# Patient Record
Sex: Female | Born: 1995 | Race: White | Hispanic: No | Marital: Married | Smoking: Never smoker
Health system: Southern US, Community
[De-identification: ages and names within clinical notes are randomized; demographics above are authoritative.]

## PROBLEM LIST (undated history)

## (undated) DIAGNOSIS — G90A Postural orthostatic tachycardia syndrome (POTS): Secondary | ICD-10-CM

## (undated) DIAGNOSIS — J45909 Unspecified asthma, uncomplicated: Secondary | ICD-10-CM

## (undated) DIAGNOSIS — N809 Endometriosis, unspecified: Secondary | ICD-10-CM

## (undated) HISTORY — PX: NO PAST SURGERIES: SHX2092

---
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
## 2016-06-07 ENCOUNTER — Encounter: Payer: Self-pay | Admitting: *Deleted

## 2016-06-07 ENCOUNTER — Emergency Department
Admission: EM | Admit: 2016-06-07 | Discharge: 2016-06-07 | Disposition: A | Discharge status: Home / Self Care | Payer: Self-pay | Attending: Emergency Medicine | Admitting: Emergency Medicine

## 2016-06-07 DIAGNOSIS — N946 Dysmenorrhea, unspecified: Secondary | ICD-10-CM | POA: Insufficient documentation

## 2016-06-07 MED ORDER — ACETAMINOPHEN-CODEINE #3 300-30 MG PO TABS: 1.0000 | ORAL_TABLET | Freq: Three times a day (TID) | ORAL | 0 refills | Status: DC | PRN

## 2016-06-07 MED ORDER — ONDANSETRON 4 MG PO TBDP: 4.0000 mg | ORAL_TABLET | Freq: Four times a day (QID) | ORAL | 0 refills | Status: DC | PRN

## 2016-06-07 NOTE — ED Provider Notes (Signed)
[facility] Emergency Department Provider Note ____________________________________________  Time seen: 1416  I have reviewed the triage vital signs and the nursing notes.  HISTORY  Chief Complaint  Abdominal Cramping  HPI Karen Mack is a 21 y.o. female presents to the ED for evaluation of severe menstrual cramps. The patient has a history of irregular menses with heavy bleeding and pain. She reports onset of menses today, with increased pelvic pain, cramping, nausea, and vomiting. She took a single Midol pill at onset, about 10 am. She now reports decreased pain from onset; now 3/10 down from 8/10. She denies any history of endometriosis, ovarian cysts, or fibroids. She reports she has never had a pelvic exam and has no current primary provider. She denies fevers, chills, or diarrhea. She came in initially because the cramps seemed to cause generalized pain throughout her body.   History reviewed. No pertinent past medical history.  There are no active problems to display for this patient.  History reviewed. No pertinent surgical history.  Prior to Admission medications   Medication Sig Start Date End Date Taking? Authorizing Provider  acetaminophen-codeine (TYLENOL #3) 300-30 MG tablet Take 1 tablet by mouth every 8 (eight) hours as needed for moderate pain. 06/07/16   Tamara Monteith V Bacon Kyaire Gruenewald, PA-C  ondansetron (ZOFRAN ODT) 4 MG disintegrating tablet Take 1 tablet (4 mg total) by mouth every 6 (six) hours as needed for nausea or vomiting. 06/07/16   Charlesetta IvoryJenise V Bacon Elenor Wildes, PA-C    Allergies Patient has no known allergies.  Family History  Problem Relation Age of Onset  . Cancer Paternal Grandmother   . Diabetes Paternal Grandfather   . Cancer Maternal Grandmother     Social History Social History  Substance Use Topics  . Smoking status: Never Smoker  . Smokeless tobacco: Never Used  . Alcohol use No    Review of Systems  Constitutional: Negative for  fever. Cardiovascular: Negative for chest pain. Respiratory: Negative for shortness of breath. Gastrointestinal: Negative for abdominal pain, vomiting and diarrhea. Genitourinary: Negative for dysuria. Menstrual cramps as above.  Musculoskeletal: Negative for back pain. Skin: Negative for rash. Neurological: Negative for headaches, focal weakness or numbness. ____________________________________________  PHYSICAL EXAM:  VITAL SIGNS: ED Triage Vitals  Enc Vitals Group     BP 06/07/16 1313 124/76     Pulse Rate 06/07/16 1313 (!) 120     Resp 06/07/16 1313 18     Temp 06/07/16 1313 99.5 F (37.5 C)     Temp Source 06/07/16 1313 Oral     SpO2 06/07/16 1313 99 %     Weight 06/07/16 1314 102 lb (46.3 kg)     Height 06/07/16 1314 5\' 1"  (1.549 m)     Head Circumference --      Peak Flow --      Pain Score 06/07/16 1313 8     Pain Loc --      Pain Edu? --      Excl. in GC? --     Constitutional: Alert and oriented. Well appearing and in no distress. Head: Normocephalic and atraumatic. Eyes: Conjunctivae are normal. PERRL. Normal extraocular movements Ears: Canals clear. TMs intact bilaterally. Nose: No congestion/rhinorrhea/epistaxis. Mouth/Throat: Mucous membranes are moist. Cardiovascular: Normal rate, regular rhythm. Normal distal pulses. Respiratory: Normal respiratory effort. No wheezes/rales/rhonchi. Gastrointestinal: Soft and nontender. No distention, rebound, guarding, or organomegaly. No CVA tenderness noted.  Musculoskeletal: Nontender with normal range of motion in all extremities.  Neurologic:  Normal gait without  ataxia. Normal speech and language. No gross focal neurologic deficits are appreciated. Skin:  Skin is warm, dry and intact. No rash noted. Psychiatric: Mood and affect are normal. Patient exhibits appropriate insight and judgment. ____________________________________________  INITIAL IMPRESSION / ASSESSMENT AND PLAN / ED COURSE  Patient with resolving  menstrual pain. She is discharged with on management of symptoms using OTC Midol, ibuprofen, and acetaminophen; at the onset of symptoms. She is also provided with prescriptions for Tylenol #3 and Zofran for pain and nausea, respectively. She is encouraged to follow-up with [facility] for routine medical care and evaluation. She is given a work note for today as requested.  ____________________________________________  FINAL CLINICAL IMPRESSION(S) / ED DIAGNOSES  Final diagnoses:  Dysmenorrhea      Lissa Hoard, PA-C 06/07/16 1706    Sharman Cheek, MD 06/08/16 1544

## 2016-06-07 NOTE — Discharge Instructions (Signed)
Your symptoms are consistent with severe menstrual cramps. Take the prescription nausea medicine and pain medicine along with your usual OTC Midol and Ibuprofen at the onset of symptoms. Hydrate and use moist heating pads to reduce pain. Follow-up with your OBG provider for continued symptoms.

## 2016-06-07 NOTE — ED Triage Notes (Signed)
States she started her period today and is having bad abd cramps that go all over her body, pt crying, states she took midol. States hx of heavy periods but states she has never seen on obgyn, crying in triage

## 2016-06-07 NOTE — ED Notes (Signed)
Mom states pt normally has heavy periods, began period today. Pt is crying. Mom states "pain through out body." when asked pt she states period pain that "shoots down my legs and my arms." Pt alert and oriented, able to move freely. Pt states she took midol this AM. States 1 pad per hour, normal period blood color. States pain with periods have never been this bad. Denies taking birth control.

## 2017-05-26 ENCOUNTER — Ambulatory Visit
Admission: EM | Admit: 2017-05-26 | Discharge: 2017-05-26 | Disposition: A | Discharge status: Home / Self Care | Payer: Self-pay | Attending: Family Medicine | Admitting: Family Medicine

## 2017-05-26 DIAGNOSIS — J029 Acute pharyngitis, unspecified: Secondary | ICD-10-CM

## 2017-05-26 LAB — RAPID STREP SCREEN (MED CTR MEBANE ONLY): Streptococcus, Group A Screen (Direct): NEGATIVE

## 2017-05-26 MED ORDER — PREDNISONE 20 MG PO TABS: ORAL_TABLET | ORAL | 0 refills | Status: DC

## 2017-05-26 NOTE — ED Triage Notes (Signed)
Sore throat symptoms started around 4 days ago.  Has redness and soreness.  Sore when yawning, swallowing, or turning head mostly to the right.   Fever yesterday though.   Denies sick contacts.

## 2017-05-26 NOTE — ED Provider Notes (Signed)
[facility]    CSN: [id number] Arrival date & time: 05/26/17  1421     History   Chief Complaint Chief Complaint  Patient presents with  . Sore Throat    HPI Karen Mack is a 22 y.o. female.   HPI  22 year old female presents with sore throat symptoms that started about 4 days ago.  Planes of redness and soreness particularly when yawning swallowing or turning her head.  She felt feverish yesterday but did not take her temperature.  She is afebrile today.  She does not complain of any coughing.  Works as a Presenter, broadcasting        History reviewed. No pertinent past medical history.  There are no active problems to display for this patient.   History reviewed. No pertinent surgical history.  OB History    No data available       Home Medications    Prior to Admission medications   Medication Sig Start Date End Date Taking? Authorizing Provider  acetaminophen-codeine (TYLENOL #3) 300-30 MG tablet Take 1 tablet by mouth every 8 (eight) hours as needed for moderate pain. 06/07/16   Menshew, Charlesetta Ivory, PA-C  ondansetron (ZOFRAN ODT) 4 MG disintegrating tablet Take 1 tablet (4 mg total) by mouth every 6 (six) hours as needed for nausea or vomiting. 06/07/16   Menshew, Charlesetta Ivory, PA-C  predniSONE (DELTASONE) 20 MG tablet Take 1 tablet (20 mg) daily by mouth. 05/26/17   Lutricia Feil, PA-C    Family History Family History  Problem Relation Age of Onset  . Cancer Paternal Grandmother   . Diabetes Paternal Grandfather   . Cancer Maternal Grandmother     Social History Social History   Tobacco Use  . Smoking status: Never Smoker  . Smokeless tobacco: Never Used  Substance Use Topics  . Alcohol use: No  . Drug use: No     Allergies   Patient has no known allergies.   Review of Systems Review of Systems  Constitutional: Positive for activity change, appetite change, fatigue and fever. Negative for chills.  HENT: Positive for  sore throat.   Respiratory: Negative for cough.   All other systems reviewed and are negative.    Physical Exam Triage Vital Signs ED Triage Vitals  Enc Vitals Group     BP 05/26/17 1454 98/63     Pulse Rate 05/26/17 1454 79     Resp 05/26/17 1454 16     Temp 05/26/17 1454 98.4 F (36.9 C)     Temp Source 05/26/17 1454 Oral     SpO2 05/26/17 1454 100 %     Weight --      Height --      Head Circumference --      Peak Flow --      Pain Score 05/26/17 1456 4     Pain Loc --      Pain Edu? --      Excl. in GC? --    No data found.  Updated Vital Signs BP 98/63 (BP Location: Left Arm)   Pulse 79   Temp 98.4 F (36.9 C) (Oral)   Resp 16   SpO2 100%   Visual Acuity Right Eye Distance:   Left Eye Distance:   Bilateral Distance:    Right Eye Near:   Left Eye Near:    Bilateral Near:     Physical Exam  Constitutional: She is oriented to person, place, and time.  She appears well-developed and well-nourished.  Non-toxic appearance. She does not appear ill. No distress.  HENT:  Head: Normocephalic.  Right Ear: Tympanic membrane normal.  Left Ear: Tympanic membrane normal.  Mouth/Throat: Uvula is midline. Mucous membranes are dry. No uvula swelling. Posterior oropharyngeal erythema present. No oropharyngeal exudate, posterior oropharyngeal edema or tonsillar abscesses. Tonsils are 0 on the right. Tonsils are 0 on the left. No tonsillar exudate.  Eyes: Pupils are equal, round, and reactive to light.  Neck: Normal range of motion. Neck supple.  Pulmonary/Chest: Effort normal and breath sounds normal.  Abdominal: Soft. Bowel sounds are normal.  Neurological: She is alert and oriented to person, place, and time.  Skin: Skin is warm and dry.  Psychiatric: She has a normal mood and affect. Her behavior is normal.  Nursing note and vitals reviewed.    UC Treatments / Results  Labs (all labs ordered are listed, but only abnormal results are displayed) Labs Reviewed    RAPID STREP SCREEN (NOT AT [facility])  CULTURE, GROUP A STREP [facility])    EKG  EKG Interpretation None       Radiology No results found.  Procedures Procedures (including critical care time)  Medications Ordered in UC Medications - No data to display   Initial Impression / Assessment and Plan / UC Course  I have reviewed the triage vital signs and the nursing notes.  Pertinent labs & imaging results that were available during my care of the patient were reviewed by me and considered in my medical decision making (see chart for details).     Plan: 1. Test/x-ray results and diagnosis reviewed with patient 2. rx as per orders; risks, benefits, potential side effects reviewed with patient 3. Recommend supportive treatment with use of salt water gargles or lozenges.  I advised her this is likely a viral illness does not require antibiotics.  Requested something for pain and I will provide her with prednisone because she is having difficulty swallowing and eating.  Worsening of her symptoms she should follow-up with primary care physician or go to the emergency room. 4. F/u prn if symptoms worsen or don't improve   Final Clinical Impressions(s) / UC Diagnoses   Final diagnoses:  Sore throat    ED Discharge Orders        Ordered    predniSONE (DELTASONE) 20 MG tablet     05/26/17 1558       Controlled Substance Prescriptions [place] Controlled Substance Registry consulted? Not Applicable   Lutricia Feil, PA-C 05/26/17 1607

## 2017-05-26 NOTE — ED Notes (Signed)
Pt in treatment room.  In NAD.  Needs address.  Pt/Fam updated on POC.   

## 2017-05-28 ENCOUNTER — Telehealth: Payer: Self-pay | Admitting: Family Medicine

## 2017-05-28 LAB — CULTURE, GROUP A STREP (THRC)

## 2017-05-28 MED ORDER — AMOXICILLIN 500 MG PO TABS: 500.0000 mg | ORAL_TABLET | Freq: Two times a day (BID) | ORAL | 0 refills | Status: DC

## 2017-05-28 NOTE — Telephone Encounter (Signed)
+   Strep. Sending in Amox. 

## 2017-09-14 ENCOUNTER — Other Ambulatory Visit: Payer: Self-pay

## 2017-09-14 ENCOUNTER — Ambulatory Visit (INDEPENDENT_AMBULATORY_CARE_PROVIDER_SITE_OTHER): Payer: Self-pay

## 2017-09-14 ENCOUNTER — Ambulatory Visit
Admission: EM | Admit: 2017-09-14 | Discharge: 2017-09-14 | Disposition: A | Discharge status: Home / Self Care | Payer: Self-pay | Attending: Family Medicine | Admitting: Family Medicine

## 2017-09-14 ENCOUNTER — Encounter: Payer: Self-pay | Admitting: Emergency Medicine

## 2017-09-14 DIAGNOSIS — R0602 Shortness of breath: Secondary | ICD-10-CM

## 2017-09-14 LAB — CBC WITH DIFFERENTIAL/PLATELET
BASOS ABS: 0 10*3/uL (ref 0–0.1)
BASOS PCT: 1 %
Eosinophils Absolute: 0.2 10*3/uL (ref 0–0.7)
Eosinophils Relative: 3 %
HEMATOCRIT: 36.9 % (ref 35.0–47.0)
HEMOGLOBIN: 12.2 g/dL (ref 12.0–16.0)
Lymphocytes Relative: 33 %
Lymphs Abs: 1.7 10*3/uL (ref 1.0–3.6)
MCH: 26.7 pg (ref 26.0–34.0)
MCHC: 33 g/dL (ref 32.0–36.0)
MCV: 81 fL (ref 80.0–100.0)
MONOS PCT: 9 %
Monocytes Absolute: 0.4 10*3/uL (ref 0.2–0.9)
NEUTROS ABS: 2.8 10*3/uL (ref 1.4–6.5)
Neutrophils Relative %: 54 %
Platelets: 209 10*3/uL (ref 150–440)
RBC: 4.56 MIL/uL (ref 3.80–5.20)
RDW: 14.8 % — ABNORMAL HIGH (ref 11.5–14.5)
WBC: 5.2 10*3/uL (ref 3.6–11.0)

## 2017-09-14 LAB — FIBRIN DERIVATIVES D-DIMER (ARMC ONLY): FIBRIN DERIVATIVES D-DIMER (ARMC): 162.13 ng{FEU}/mL (ref 0.00–499.00)

## 2017-09-14 MED ORDER — ALBUTEROL SULFATE HFA 108 (90 BASE) MCG/ACT IN AERS: 1.0000 | INHALATION_SPRAY | Freq: Four times a day (QID) | RESPIRATORY_TRACT | 0 refills | Status: AC | PRN

## 2017-09-14 NOTE — Discharge Instructions (Addendum)
Albuterol as needed.  If persists, see pulmonology.  Take care  Dr. Adriana Simasook

## 2017-09-14 NOTE — ED Provider Notes (Signed)
[facility]    CSN: [id number] Arrival date & time: 09/14/17  4098  History   Chief Complaint Chief Complaint  Patient presents with  . Shortness of Breath   HPI  22 year old female presents with shortness of breath.  Patient reports a 2 to 33-month history of ongoing shortness of breath.  She states that it occurs predominantly when she is exercising or exerting herself.  Also seems to be worse at night.  No PND or orthopnea.  Patient actually states that it is better when she lies down.  No reports of wheezing.  No history of asthma.  She has had recent long travel.  She recently traveled to [place] via car.  She states that they stopped for 5 times.  She is not on an OCP.  No reports of weight gain.  No lower extremity edema.  No other associated symptoms.  No other complaints.  PMH - No significant past medical history.  Surgical history - No past surgeries.  Home Medications    Prior to Admission medications   Medication Sig Start Date End Date Taking? Authorizing Provider  albuterol (PROVENTIL HFA;VENTOLIN HFA) 108 (90 Base) MCG/ACT inhaler Inhale 1-2 puffs into the lungs every 6 (six) hours as needed for wheezing or shortness of breath. 09/14/17   Tommie Sams, DO    Family History Family History  Problem Relation Age of Onset  . Cancer Paternal Grandmother   . Diabetes Paternal Grandfather   . Cancer Maternal Grandmother     Social History Social History   Tobacco Use  . Smoking status: Never Smoker  . Smokeless tobacco: Never Used  Substance Use Topics  . Alcohol use: No  . Drug use: No     Allergies   Patient has no known allergies.   Review of Systems Review of Systems  Constitutional: Negative for fever.  Respiratory: Positive for chest tightness and shortness of breath.    Physical Exam Triage Vital Signs ED Triage Vitals  Enc Vitals Group     BP 09/14/17 0825 93/70     Pulse Rate 09/14/17 0825 69     Resp 09/14/17 0825 16   Temp 09/14/17 0825 98.2 F (36.8 C)     Temp Source 09/14/17 0825 Oral     SpO2 09/14/17 0825 100 %     Weight 09/14/17 0822 100 lb (45.4 kg)     Height 09/14/17 0822 5\' 1"  (1.549 m)     Head Circumference --      Peak Flow --      Pain Score 09/14/17 0822 0     Pain Loc --      Pain Edu? --      Excl. in GC? --    Updated Vital Signs BP 93/70 (BP Location: Left Arm)   Pulse 69   Temp 98.2 F (36.8 C) (Oral)   Resp 16   Ht 5\' 1"  (1.549 m)   Wt 100 lb (45.4 kg)   LMP 08/17/2017 (Approximate) Comment: denies preg  SpO2 100%   BMI 18.89 kg/m  Physical Exam  Constitutional: She is oriented to person, place, and time. She appears well-developed. No distress.  HENT:  Head: Normocephalic and atraumatic.  Mouth/Throat: Oropharynx is clear and moist.  Eyes: Conjunctivae are normal.  Cardiovascular: Normal rate and regular rhythm.  Pulmonary/Chest: Effort normal and breath sounds normal. She has no wheezes. She has no rales.  Neurological: She is alert and oriented to person, place, and time.  Psychiatric:  She has a normal mood and affect. Her behavior is normal.  Nursing note and vitals reviewed.  UC Treatments / Results  Labs (all labs ordered are listed, but only abnormal results are displayed) Labs Reviewed  CBC WITH DIFFERENTIAL/PLATELET - Abnormal; Notable for the following components:      Result Value   RDW 14.8 (*)    All other components within normal limits  FIBRIN DERIVATIVES D-DIMER [facility] ONLY)    EKG None  Radiology Dg Chest 2 View  Result Date: 09/14/2017 CLINICAL DATA:  Shortness of breath for the past 2-3 months. EXAM: CHEST - 2 VIEW COMPARISON:  None. FINDINGS: Normal cardiac silhouette and mediastinal contours. Excellent inspiratory effort. No focal airspace opacities. No pleural effusion or pneumothorax. No evidence of edema. No acute osseus abnormalities. IMPRESSION: No acute cardiopulmonary disease. Electronically Signed   By: Simonne ComeJohn  Watts M.D.   On:  09/14/2017 09:06    Procedures Procedures (including critical care time)  Medications Ordered in UC Medications - No data to display  Initial Impression / Assessment and Plan / UC Course  I have reviewed the triage vital signs and the nursing notes.  Pertinent labs & imaging results that were available during my care of the patient were reviewed by me and considered in my medical decision making (see chart for details).    22 year old female presents with shortness of breath.  Exam unrevealing.  Chest x-ray normal.  CBC without evidence of anemia.  D-dimer negative.  Unclear etiology at this time.  Possible exercise-induced asthma.  Albuterol as needed.  Advised to see pulmonology if fails to improve or worsens.  Final Clinical Impressions(s) / UC Diagnoses   Final diagnoses:  SOB (shortness of breath)     Discharge Instructions     Albuterol as needed.  If persists, see pulmonology.  Take care  Dr. Adriana Simasook     ED Prescriptions    Medication Sig Dispense Auth. Provider   albuterol (PROVENTIL HFA;VENTOLIN HFA) 108 (90 Base) MCG/ACT inhaler Inhale 1-2 puffs into the lungs every 6 (six) hours as needed for wheezing or shortness of breath. 1 Inhaler Tommie Samsook, Asta Corbridge G, DO     Controlled Substance Prescriptions [place] Controlled Substance Registry consulted? Not Applicable   Tommie SamsCook, Tc [place] G, [place] 09/14/17 [id number]

## 2017-09-14 NOTE — ED Triage Notes (Signed)
Patient c/o SOB for the past 2-3 months. Patient states that her SOB is worse when she is exerting herself or exercising.

## 2017-09-26 ENCOUNTER — Ambulatory Visit: Payer: Self-pay | Admitting: Pulmonary Disease

## 2017-09-26 ENCOUNTER — Encounter: Payer: Self-pay | Admitting: Pulmonary Disease

## 2017-09-26 VITALS — BP 108/68 | HR 96 | Ht 61.0 in | Wt 100.0 lb

## 2017-09-26 DIAGNOSIS — J302 Other seasonal allergic rhinitis: Secondary | ICD-10-CM

## 2017-09-26 DIAGNOSIS — J4540 Moderate persistent asthma, uncomplicated: Secondary | ICD-10-CM

## 2017-09-26 DIAGNOSIS — R0609 Other forms of dyspnea: Secondary | ICD-10-CM

## 2017-09-26 MED ORDER — FLUTICASONE-SALMETEROL 113-14 MCG/ACT IN AEPB: 1.0000 | INHALATION_SPRAY | Freq: Two times a day (BID) | RESPIRATORY_TRACT | 10 refills | Status: AC

## 2017-09-26 NOTE — Progress Notes (Signed)
PULMONARY CONSULT NOTE  Requesting MD/Service: Self Date of initial consultation: 09/26/17 Reason for consultation: asthma  PT PROFILE: 22 y.o. female never smoker seen in urgent care center 09/14/17 for exertional dyspnea with reportedly normal exam, normal D-dimer and CXR. Dx of possible exercise induced asthma. Prescribed albuterol MDI to be used as needed  DATA:   INTERVAL:   HPI:  As above.  She has no past pulmonary history.  She has noted episodic exertional dyspnea since January or February of this year.  Initially, she dismissed it as "stress".  However, her symptoms have progressed to dyspnea on mild to moderate exertion.  It is exacerbated by heat and humidity.  It is worse at night.  She describes difficulty getting a deep breath and chest tightness.  She denies cough.  She does have occasional nocturnal awakenings.  She denies lower extremity edema and calf tenderness.  She does report a sensation of "panic" but believes that the dyspnea always proceeds the sense of panic.  She has a history of pollen allergies.  Historically, this is manifested as rhinitis, itching eyes and sneezing.  She believes her allergy symptoms have progressively worsened over the past 5 years.  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  She has a history of wheat sensitivity.  History reviewed. No pertinent past medical history.  History reviewed. No pertinent surgical history.  MEDICATIONS: I have reviewed all medications and confirmed regimen as documented  Social History   Socioeconomic History  . Marital status: Single    Spouse name: Not on file  . Number of children: Not on file  . Years of education: Not on file  . Highest education level: Not on file  Occupational History  . Not on file  Social Needs  . Financial resource strain: Not on file  . Food insecurity:    Worry: Not on file    Inability: Not on file  . Transportation needs:     Medical: Not on file    Non-medical: Not on file  Tobacco Use  . Smoking status: Never Smoker  . Smokeless tobacco: Never Used  Substance and Sexual Activity  . Alcohol use: No  . Drug use: No  . Sexual activity: Not on file  Lifestyle  . Physical activity:    Days per week: Not on file    Minutes per session: Not on file  . Stress: Not on file  Relationships  . Social connections:    Talks on phone: Not on file    Gets together: Not on file    Attends religious service: Not on file    Active member of club or organization: Not on file    Attends meetings of clubs or organizations: Not on file    Relationship status: Not on file  . Intimate partner violence:    Fear of current or ex partner: Not on file    Emotionally abused: Not on file    Physically abused: Not on file    Forced sexual activity: Not on file  Other Topics Concern  . Not on file  Social History Narrative  . Not on file    Family History  Problem Relation Age of Onset  . Cancer Paternal Grandmother   . Diabetes Paternal Grandfather   . Cancer Maternal Grandmother     ROS: No fever, myalgias/arthralgias, unexplained weight loss or weight gain No new focal weakness or sensory deficits No otalgia, hearing loss, visual changes,  nasal and sinus symptoms, mouth and throat problems No neck pain or adenopathy No abdominal pain, N/V/D, diarrhea, change in bowel pattern No dysuria, change in urinary pattern   Vitals:   09/26/17 1100 09/26/17 1109  BP:  108/68  Pulse:  96  SpO2:  99%  Weight:  100 lb (45.4 kg)  Height: 5\' 1"  (1.549 m)      EXAM:  Gen: WDWN, No overt respiratory distress HEENT: NCAT, sclera white, oropharynx normal Neck: Supple without LAN, thyromegaly, JVD Lungs: breath sounds full, percussion normal, no wheezes Cardiovascular: RRR, no murmurs noted Abdomen: Soft, nontender, normal BS Ext: without clubbing, cyanosis, edema Neuro: CNs grossly intact, motor and sensory  intact Skin: Limited exam, no lesions noted  DATA:   BMP Latest Ref Rng & Units 09/02/2015  Glucose 65 - 99 mg/dL 86  BUN 6 - 20 mg/dL 16  Creatinine 1.610.44 - 0.961.00 mg/dL 0.450.66  Sodium 409135 - 811145 mmol/L 137  Potassium 3.5 - 5.1 mmol/L 4.0  Chloride 101 - 111 mmol/L 103  CO2 22 - 32 mmol/L 26  Calcium 8.9 - 10.3 mg/dL 9.5    CBC Latest Ref Rng & Units 09/14/2017 09/02/2015  WBC 3.6 - 11.0 K/uL 5.2 6.6  Hemoglobin 12.0 - 16.0 g/dL 91.412.2 78.212.9  Hematocrit 95.635.0 - 47.0 % 36.9 38.6  Platelets 150 - 440 K/uL 209 246    CXR 09/14/2017: No acute cardiac or pulmonary findings  I have personally reviewed all chest radiographs reported above including CXRs and CT chest unless otherwise indicated  IMPRESSION:     ICD-10-CM   1. Moderate persistent asthma without complication J45.40 Pulmonary Function Test [facility] Only  2. DOE (dyspnea on exertion) R06.09 Pulmonary Function Test [facility] Only  3. Seasonal allergies J30.2    Her history is highly suggestive of asthma including a background of seasonal allergies, episodic dyspnea and chest tightness with apparent benefit after bronchodilator therapy  PLAN:  AirDuo inhaler prescribed -1 inhalation twice a day.  She has been instructed to rinse her mouth after use  She is to continue albuterol inhaler as needed for increased S OB, chest tightness, wheezing, cough  I also instructed that she may use albuterol inhaler prior to exercise  Full pulmonary function tests ordered  Follow-up in 6 weeks   Billy Fischeravid Terryn Rosenkranz, MD PCCM service Mobile [phone] Pager [phone] 09/27/2017 11:54 AM

## 2017-09-26 NOTE — Patient Instructions (Signed)
Airduo inhaler - one inhalation twice a day. Rinse mouth after use  Continue albuterol inhaler as needed for increased shortness of breath, chest tightness, wheezing, cough  You may use albuterol inhaler, 1-2 actuations prior to exercise  Full PFTs (lung function test) ordered  Follow-up in 6 weeks

## 2017-09-27 ENCOUNTER — Encounter: Payer: Self-pay | Admitting: Pulmonary Disease

## 2017-10-02 ENCOUNTER — Other Ambulatory Visit: Payer: Self-pay | Admitting: Family Medicine

## 2017-11-02 ENCOUNTER — Inpatient Hospital Stay: Admission: RE | Admit: 2017-11-02 | Payer: Self-pay | Source: Ambulatory Visit

## 2017-11-02 ENCOUNTER — Ambulatory Visit: Payer: Medicaid Other | Attending: Pulmonary Disease

## 2017-11-02 DIAGNOSIS — J4540 Moderate persistent asthma, uncomplicated: Secondary | ICD-10-CM | POA: Insufficient documentation

## 2017-11-02 DIAGNOSIS — R0609 Other forms of dyspnea: Secondary | ICD-10-CM | POA: Insufficient documentation

## 2017-11-02 MED ORDER — ALBUTEROL SULFATE (2.5 MG/3ML) 0.083% IN NEBU
2.5000 mg | INHALATION_SOLUTION | Freq: Once | RESPIRATORY_TRACT | Status: AC
  Administered 2017-11-02: 2.5 mg via RESPIRATORY_TRACT
  Filled 2017-11-02: qty 3

## 2017-11-09 ENCOUNTER — Encounter: Payer: Self-pay | Admitting: Pulmonary Disease

## 2017-11-09 ENCOUNTER — Ambulatory Visit (INDEPENDENT_AMBULATORY_CARE_PROVIDER_SITE_OTHER): Payer: Self-pay | Admitting: Pulmonary Disease

## 2017-11-09 VITALS — BP 98/60 | HR 76 | Ht 61.0 in | Wt 103.0 lb

## 2017-11-09 DIAGNOSIS — J4530 Mild persistent asthma, uncomplicated: Secondary | ICD-10-CM

## 2017-11-09 NOTE — Progress Notes (Signed)
PULMONARY OFFICE FOLLOW-UP NOTE  Requesting MD/Service: Self Date of initial consultation: 09/26/17 Reason for consultation: asthma  PT PROFILE: 22 y.o. female never smoker seen in urgent care center 09/14/17 for exertional dyspnea with reportedly normal exam, normal D-dimer and CXR. Dx of possible exercise induced asthma. Prescribed albuterol MDI to be used as needed  DATA: 11/02/17 PFTs: FVC: 3.87 L (114 %pred), FEV1: 3.04 L (99 %pred), FEV1/FVC: 39%, TLC: 5.82 L (126 %pred), DLCO 84 %pred    INTERVAL: No major events  SUBJ:  Scheduled return office visit. Here to review results of PFTs (documented above) and assess response to ICS/LABA initiated last visit. She feels that AirDuo has been beneficial with less SOB and chest tightness. She denies nocturnal symptoms. She denies CP, fever, purulent sputum, hemoptysis, LE edema and calf tenderness.   MEDICATIONS: I have reviewed all medications and confirmed regimen as documented   Vitals:   11/09/17 1412 11/09/17 1414  BP:  98/60  Pulse:  76  SpO2:  100%  Weight: 103 lb (46.7 kg)   Height: 5\' 1"  (1.549 m)      EXAM:  Gen: NAD HEENT: NCAT, sclera white Neck: No JVD Lungs: breath sounds full, no wheezes or other adventitious sounds Cardiovascular: RRR, no murmurs Abdomen: Soft, nontender, normal BS Ext: without clubbing, cyanosis, edema Neuro: grossly intact Skin: Limited exam, no lesions noted    DATA:   BMP Latest Ref Rng & Units 09/02/2015  Glucose 65 - 99 mg/dL 86  BUN 6 - 20 mg/dL 16  Creatinine 3.240.44 - 4.011.00 mg/dL 0.270.66  Sodium 253135 - 664145 mmol/L 137  Potassium 3.5 - 5.1 mmol/L 4.0  Chloride 101 - 111 mmol/L 103  CO2 22 - 32 mmol/L 26  Calcium 8.9 - 10.3 mg/dL 9.5    CBC Latest Ref Rng & Units 09/14/2017 09/02/2015  WBC 3.6 - 11.0 K/uL 5.2 6.6  Hemoglobin 12.0 - 16.0 g/dL 40.312.2 47.412.9  Hematocrit 25.935.0 - 47.0 % 36.9 38.6  Platelets 150 - 440 K/uL 209 246    CXR: No new film  I have personally reviewed all  chest radiographs reported above including CXRs and CT chest unless otherwise indicated  IMPRESSION:     ICD-10-CM   1. Mild persistent asthma without complication J45.30     Although PFTs do not show definite obstruction, her history is compelling for asthma and she has had a favorable response to ICS/LABA with improved SOB and chest tightness.    PLAN:  Cont Airduo inhaler twice a day as currently prescribed  Continue albuterol inhaler as needed for increased shortness of breath, wheezing, chest tightness, cough  Follow-up in 6 months at which time we will consider whether we can "stepdown" asthma controller therapy.  Call sooner if needed   Billy Fischeravid Simonds, MD PCCM service Mobile [phone] Pager [phone] 11/13/2017 9:50 PM

## 2017-11-09 NOTE — Patient Instructions (Signed)
Cont Airduo inhaler twice a day as currently prescribed  Continue albuterol inhaler as needed for increased shortness of breath, wheezing, chest tightness, cough  Follow-up in 6 months at which time we will consider whether we can "stepdown" asthma controller therapy.  Call sooner if needed

## 2018-05-04 ENCOUNTER — Encounter: Payer: Self-pay | Admitting: Emergency Medicine

## 2018-05-04 ENCOUNTER — Other Ambulatory Visit: Payer: Self-pay

## 2018-05-04 DIAGNOSIS — Z79899 Other long term (current) drug therapy: Secondary | ICD-10-CM | POA: Insufficient documentation

## 2018-05-04 DIAGNOSIS — O26892 Other specified pregnancy related conditions, second trimester: Secondary | ICD-10-CM | POA: Diagnosis present

## 2018-05-04 DIAGNOSIS — R040 Epistaxis: Secondary | ICD-10-CM | POA: Insufficient documentation

## 2018-05-04 NOTE — ED Triage Notes (Signed)
Pt states nosebleed that began 40 min pta. Nosebleed is resolved currently. Pt is [redacted] weeks pregnant, fht 154 by doppler in triage. Positive fetal movement per pt.

## 2018-05-05 ENCOUNTER — Emergency Department
Admission: EM | Admit: 2018-05-05 | Discharge: 2018-05-05 | Disposition: A | Discharge status: Home / Self Care | Payer: Medicaid Other | Attending: Emergency Medicine | Admitting: Emergency Medicine

## 2018-05-05 DIAGNOSIS — R040 Epistaxis: Secondary | ICD-10-CM

## 2018-05-05 NOTE — ED Notes (Signed)
Pt observed to be without acute distress at this time , no active bleeding .

## 2018-05-05 NOTE — ED Provider Notes (Signed)
[facility] Emergency Department Provider Note   First MD Initiated Contact with Patient 05/05/18 0124     (approximate)  I have reviewed the triage vital signs and the nursing notes.   HISTORY  Chief Complaint Epistaxis    HPI Karen Mack is a 23 y.o. female G1, P0 at [redacted] weeks pregnant presents to the emergency department with acute onset of epistaxis that began 40 minutes before arrival to the ED.  Patient admits to previous episodes of epistaxis however she states none that was ever this bad.  Nosebleed resolved before arrival to the ED.   No past medical history on file.  There are no active problems to display for this patient.   No past surgical history on file.  Prior to Admission medications   Medication Sig Start Date End Date Taking? Authorizing Provider  albuterol (PROVENTIL HFA;VENTOLIN HFA) 108 (90 Base) MCG/ACT inhaler Inhale 1-2 puffs into the lungs every 6 (six) hours as needed for wheezing or shortness of breath. 09/14/17   Tommie Sams, DO  Fluticasone-Salmeterol (AIRDUO RESPICLICK 113/14) 113-14 MCG/ACT AEPB Inhale 1 puff into the lungs 2 (two) times daily. 09/26/17   Merwyn Katos, MD    Allergies Patient has no known allergies.  Family History  Problem Relation Age of Onset  . Cancer Paternal Grandmother   . Diabetes Paternal Grandfather   . Cancer Maternal Grandmother     Social History Social History   Tobacco Use  . Smoking status: Never Smoker  . Smokeless tobacco: Never Used  Substance Use Topics  . Alcohol use: No  . Drug use: No    Review of Systems Constitutional: No fever/chills Eyes: No visual changes positive for epistaxiS (resolved). ENT: No sore throat. Cardiovascular: Denies chest pain. Respiratory: Denies shortness of breath. Gastrointestinal: No abdominal pain.  No nausea, no vomiting.  No diarrhea.  No constipation. Genitourinary: Negative for dysuria. Musculoskeletal: Negative for neck pain.   Negative for back pain. Integumentary: Negative for rash. Neurological: Negative for headaches, focal weakness or numbness.   ____________________________________________   PHYSICAL EXAM:  VITAL SIGNS: ED Triage Vitals [05/04/18 2307]  Enc Vitals Group     BP (!) 146/88     Pulse Rate (!) 109     Resp 20     Temp 98.3 F (36.8 C)     Temp Source Oral     SpO2 100 %     Weight 57.6 kg (127 lb)     Height 1.575 m (5\' 2" )     Head Circumference      Peak Flow      Pain Score 0     Pain Loc      Pain Edu?      Excl. in GC?     Constitutional: Alert and oriented. Well appearing and in no acute distress. Eyes: Conjunctivae are normal. Nose: No congestion/rhinnorhea.  Punctate area anterior nasal septum on the left consistent with recent bleeding. Mouth/Throat: Mucous membranes are moist. Oropharynx non-erythematous. Neck: No stridor.  Cardiovascular: Normal rate, regular rhythm. Good peripheral circulation. Grossly normal heart sounds. Respiratory: Normal respiratory effort.  No retractions. Lungs CTAB. Gastrointestinal: Soft and nontender. No distention.  Musculoskeletal: No lower extremity tenderness nor edema. Neurologic:  Normal speech and language. No gross focal neurologic deficits are appreciated.  Skin:  Skin is warm, dry and intact. No rash noted.   Procedures   ____________________________________________   INITIAL IMPRESSION / ASSESSMENT AND PLAN / ED COURSE  As  part of my medical decision making, I reviewed the following data within the electronic MEDICAL RECORD NUMBER   23 year old female presented with above-stated history and physical exam secondary to epistaxis that resolved before my evaluation. ____________________________________________  FINAL CLINICAL IMPRESSION(S) / ED DIAGNOSES  Final diagnoses:  Epistaxis     MEDICATIONS GIVEN DURING THIS VISIT:  Medications - No data to display   ED Discharge Orders    None       Note:  This  document was prepared using Dragon voice recognition software and may include unintentional dictation errors.   Darci Current, MD 05/05/18 [phone]

## 2018-07-14 ENCOUNTER — Encounter: Payer: Self-pay | Admitting: Emergency Medicine

## 2018-07-14 ENCOUNTER — Ambulatory Visit
Admission: EM | Admit: 2018-07-14 | Discharge: 2018-07-14 | Disposition: A | Discharge status: Home / Self Care | Payer: Medicaid Other | Attending: Family Medicine | Admitting: Family Medicine

## 2018-07-14 ENCOUNTER — Other Ambulatory Visit: Payer: Self-pay

## 2018-07-14 DIAGNOSIS — L03031 Cellulitis of right toe: Secondary | ICD-10-CM | POA: Diagnosis not present

## 2018-07-14 MED ORDER — CEPHALEXIN 500 MG PO CAPS: 500.0000 mg | ORAL_CAPSULE | Freq: Three times a day (TID) | ORAL | 0 refills | Status: DC

## 2018-07-14 NOTE — ED Triage Notes (Signed)
Patient c/o right big toe infection that started 2 months ago.

## 2018-07-14 NOTE — ED Provider Notes (Signed)
[facility]    CSN: [id number] Arrival date & time: 07/14/18  1322     History   Chief Complaint Chief Complaint  Patient presents with  . Nail Problem    HPI Karen Mack is a 23 y.o. female.   23 yo female with a c/o right big toe redness, swelling and drainage worsening over the last 2 months. Denies any fevers, chills.   The history is provided by the patient.    History reviewed. No pertinent past medical history.  There are no active problems to display for this patient.   History reviewed. No pertinent surgical history.  OB History    Gravida  1   Para      Term      Preterm      AB      Living        SAB      TAB      Ectopic      Multiple      Live Births               Home Medications    Prior to Admission medications   Medication Sig Start Date End Date Taking? Authorizing Provider  albuterol (PROVENTIL HFA;VENTOLIN HFA) 108 (90 Base) MCG/ACT inhaler Inhale 1-2 puffs into the lungs every 6 (six) hours as needed for wheezing or shortness of breath. 09/14/17  Yes Cook, Jayce G, DO  cephALEXin (KEFLEX) 500 MG capsule Take 1 capsule (500 mg total) by mouth 3 (three) times daily. 07/14/18   Payton Mccallum, MD  Fluticasone-Salmeterol (AIRDUO RESPICLICK 113/14) 113-14 MCG/ACT AEPB Inhale 1 puff into the lungs 2 (two) times daily. 09/26/17   Merwyn Katos, MD    Family History Family History  Problem Relation Age of Onset  . Cancer Paternal Grandmother   . Diabetes Paternal Grandfather   . Cancer Maternal Grandmother     Social History Social History   Tobacco Use  . Smoking status: Never Smoker  . Smokeless tobacco: Never Used  Substance Use Topics  . Alcohol use: No  . Drug use: No     Allergies   Patient has no known allergies.   Review of Systems Review of Systems   Physical Exam Triage Vital Signs ED Triage Vitals  Enc Vitals Group     BP 07/14/18 1404 129/66     Pulse Rate 07/14/18 1404 97     Resp  07/14/18 1404 18     Temp 07/14/18 1404 98.7 F (37.1 C)     Temp Source 07/14/18 1404 Oral     SpO2 07/14/18 1404 100 %     Weight 07/14/18 1402 120 lb (54.4 kg)     Height 07/14/18 1402 5\' 2"  (1.575 m)     Head Circumference --      Peak Flow --      Pain Score 07/14/18 1402 2     Pain Loc --      Pain Edu? --      Excl. in GC? --    No data found.  Updated Vital Signs BP 129/66 (BP Location: Right Arm)   Pulse 97   Temp 98.7 F (37.1 C) (Oral)   Resp 18   Ht 5\' 2"  (1.575 m)   Wt 54.4 kg   LMP 08/17/2017 (Approximate) Comment: denies preg  SpO2 100%   Breastfeeding Yes   BMI 21.95 kg/m   Visual Acuity Right Eye Distance:   Left Eye Distance:  Bilateral Distance:    Right Eye Near:   Left Eye Near:    Bilateral Near:     Physical Exam Vitals signs and nursing note reviewed.  Constitutional:      General: She is not in acute distress. Musculoskeletal:     Right foot: Tenderness (right big toe swelling, blanchable erythema, warmth and tenderness to palpation) and swelling present.  Neurological:     Mental Status: She is alert.      UC Treatments / Results  Labs (all labs ordered are listed, but only abnormal results are displayed) Labs Reviewed - No data to display  EKG None  Radiology No results found.  Procedures Procedures (including critical care time)  Medications Ordered in UC Medications - No data to display  Initial Impression / Assessment and Plan / UC Course  I have reviewed the triage vital signs and the nursing notes.  Pertinent labs & imaging results that were available during my care of the patient were reviewed by me and considered in my medical decision making (see chart for details).      Final Clinical Impressions(s) / UC Diagnoses   Final diagnoses:  Cellulitis of toe of right foot    ED Prescriptions    Medication Sig Dispense Auth. Provider   cephALEXin (KEFLEX) 500 MG capsule Take 1 capsule (500 mg total) by  mouth 3 (three) times daily. 30 capsule Payton Mccallumonty, Tressia Labrum, MD     1. diagnosis reviewed with patient 2. rx as per orders above; reviewed possible side effects, interactions, risks and benefits  3. Recommend supportive treatment with rest, warm compresses, elevation 4. Follow-up prn if symptoms worsen or don't improve   Controlled Substance Prescriptions [place] Controlled Substance Registry consulted? Not Applicable   Payton Mccallumonty, Natsuko Kelsay, MD 07/14/18 [phone]

## 2018-07-30 ENCOUNTER — Other Ambulatory Visit: Payer: Self-pay

## 2018-07-30 ENCOUNTER — Encounter: Payer: Self-pay | Admitting: Emergency Medicine

## 2018-07-30 ENCOUNTER — Ambulatory Visit
Admission: EM | Admit: 2018-07-30 | Discharge: 2018-07-30 | Disposition: A | Discharge status: Home / Self Care | Payer: Medicaid Other | Attending: Family Medicine | Admitting: Family Medicine

## 2018-07-30 DIAGNOSIS — L03031 Cellulitis of right toe: Secondary | ICD-10-CM

## 2018-07-30 MED ORDER — SULFAMETHOXAZOLE-TRIMETHOPRIM 800-160 MG PO TABS: 1.0000 | ORAL_TABLET | Freq: Two times a day (BID) | ORAL | 0 refills | Status: DC

## 2018-07-30 NOTE — Discharge Instructions (Signed)
Warm compresses to area °Elevate foot °

## 2018-07-30 NOTE — ED Provider Notes (Signed)
[facility]    CSN: [id number] Arrival date & time: 07/30/18  1103     History   Chief Complaint Chief Complaint  Patient presents with  . Nail Problem    right great toe    HPI Karen Mack is a 23 y.o. female.   23 yo female with a c/o right great big toe redness and drainage that has not resolved. Patient was seen 16 days ago initially with cellulitis and was given keflex. States symptoms improved but did not resolve completely. Now worsening again. Denies any fevers/chills.      History reviewed. No pertinent past medical history.  There are no active problems to display for this patient.   Past Surgical History:  Procedure Laterality Date  . NO PAST SURGERIES      OB History    Gravida  1   Para      Term      Preterm      AB      Living        SAB      TAB      Ectopic      Multiple      Live Births               Home Medications    Prior to Admission medications   Medication Sig Start Date End Date Taking? Authorizing Provider  albuterol (PROVENTIL HFA;VENTOLIN HFA) 108 (90 Base) MCG/ACT inhaler Inhale 1-2 puffs into the lungs every 6 (six) hours as needed for wheezing or shortness of breath. 09/14/17  Yes Cook, Jayce G, DO  cephALEXin (KEFLEX) 500 MG capsule Take 1 capsule (500 mg total) by mouth 3 (three) times daily. 07/14/18  Yes Payton Mccallum, MD  Fluticasone-Salmeterol (AIRDUO RESPICLICK 113/14) 113-14 MCG/ACT AEPB Inhale 1 puff into the lungs 2 (two) times daily. 09/26/17  Yes Merwyn Katos, MD  sulfamethoxazole-trimethoprim (BACTRIM DS) 800-160 MG tablet Take 1 tablet by mouth 2 (two) times daily. 07/30/18   Payton Mccallum, MD    Family History Family History  Problem Relation Age of Onset  . Cancer Paternal Grandmother   . Diabetes Paternal Grandfather   . Cancer Maternal Grandmother     Social History Social History   Tobacco Use  . Smoking status: Never Smoker  . Smokeless tobacco: Never Used  Substance  Use Topics  . Alcohol use: No  . Drug use: No     Allergies   Patient has no known allergies.   Review of Systems Review of Systems   Physical Exam Triage Vital Signs ED Triage Vitals  Enc Vitals Group     BP 07/30/18 1123 99/72     Pulse Rate 07/30/18 1123 78     Resp 07/30/18 1123 16     Temp 07/30/18 1123 98.6 F (37 C)     Temp Source 07/30/18 1123 Oral     SpO2 07/30/18 1123 100 %     Weight 07/30/18 1121 130 lb (59 kg)     Height 07/30/18 1121 5\' 2"  (1.575 m)     Head Circumference --      Peak Flow --      Pain Score 07/30/18 1120 2     Pain Loc --      Pain Edu? --      Excl. in GC? --    No data found.  Updated Vital Signs BP 99/72 (BP Location: Left Arm)   Pulse 78   Temp 98.6 F (  37 C) (Oral)   Resp 16   Ht 5\' 2"  (1.575 m)   Wt 59 kg   SpO2 100%   Breastfeeding Yes   BMI 23.78 kg/m   Visual Acuity Right Eye Distance:   Left Eye Distance:   Bilateral Distance:    Right Eye Near:   Left Eye Near:    Bilateral Near:     Physical Exam Vitals signs and nursing note reviewed.  Constitutional:      General: She is not in acute distress.    Appearance: She is not diaphoretic.  Skin:    Comments: Right great big toe with mild purulent drainage at the edge of the nail with tenderness and mild erythema of the surrounding skin  Neurological:     Mental Status: She is alert.      UC Treatments / Results  Labs (all labs ordered are listed, but only abnormal results are displayed) Labs Reviewed - No data to display  EKG None  Radiology No results found.  Procedures Procedures (including critical care time)  Medications Ordered in UC Medications - No data to display  Initial Impression / Assessment and Plan / UC Course  I have reviewed the triage vital signs and the nursing notes.  Pertinent labs & imaging results that were available during my care of the patient were reviewed by me and considered in my medical decision making (see  chart for details).      Final Clinical Impressions(s) / UC Diagnoses   Final diagnoses:  Cellulitis of right toe     Discharge Instructions     Warm compresses to area Elevate foot    ED Prescriptions    Medication Sig Dispense Auth. Provider   sulfamethoxazole-trimethoprim (BACTRIM DS) 800-160 MG tablet Take 1 tablet by mouth 2 (two) times daily. 20 tablet Payton Mccallumonty, Tiffanye Hartmann, MD     1. diagnosis reviewed with patient 2. rx as per orders above; reviewed possible side effects, interactions, risks and benefits  3. Recommend supportive treatment as above 4. Recommend follow up with podiatry 5. Follow-up prn   Controlled Substance Prescriptions [employer] Controlled Substance Registry consulted? Not Applicable   Payton Mccallumonty, Kyriana Yankee, MD 07/30/18 1800

## 2018-07-30 NOTE — ED Triage Notes (Signed)
Pt c/o right great toe pain and infection. Started 2 months ago. She was seen on 07/14/18 and given antibiotics but they are not helping.

## 2019-02-05 ENCOUNTER — Other Ambulatory Visit: Payer: Self-pay | Admitting: Family Medicine

## 2020-04-23 ENCOUNTER — Ambulatory Visit
Admission: RE | Admit: 2020-04-23 | Discharge: 2020-04-23 | Disposition: A | Discharge status: Home / Self Care | Payer: Medicaid Other | Source: Ambulatory Visit | Attending: Registered Nurse | Admitting: Registered Nurse

## 2020-04-23 ENCOUNTER — Other Ambulatory Visit: Payer: Self-pay

## 2020-04-23 ENCOUNTER — Other Ambulatory Visit: Payer: Self-pay | Admitting: Registered Nurse

## 2020-04-23 ENCOUNTER — Other Ambulatory Visit
Admission: RE | Admit: 2020-04-23 | Discharge: 2020-04-23 | Disposition: A | Discharge status: Home / Self Care | Payer: Medicaid Other | Source: Home / Self Care | Attending: Registered Nurse | Admitting: Registered Nurse

## 2020-04-23 DIAGNOSIS — R0789 Other chest pain: Secondary | ICD-10-CM | POA: Insufficient documentation

## 2020-05-06 ENCOUNTER — Other Ambulatory Visit: Payer: Self-pay | Admitting: Registered Nurse

## 2020-05-06 DIAGNOSIS — R222 Localized swelling, mass and lump, trunk: Secondary | ICD-10-CM

## 2020-05-14 ENCOUNTER — Ambulatory Visit
Admission: RE | Admit: 2020-05-14 | Discharge: 2020-05-14 | Disposition: A | Discharge status: Home / Self Care | Payer: Medicaid Other | Source: Ambulatory Visit | Attending: Registered Nurse | Admitting: Registered Nurse

## 2020-05-14 ENCOUNTER — Other Ambulatory Visit: Payer: Self-pay

## 2020-05-14 DIAGNOSIS — R222 Localized swelling, mass and lump, trunk: Secondary | ICD-10-CM | POA: Insufficient documentation

## 2021-06-12 IMAGING — US US SOFT TISSUE
1 series · 11 of 11 positions shown · non-contrast
Comparison: None.

CLINICAL DATA: Painful chest wall mass, anterior left chest for 3
months

EXAM:
ULTRASOUND OF CHEST SOFT TISSUES
TECHNIQUE: Ultrasound examination of the chest wall soft tissues was performed
in the area of clinical concern.

[Series 1: us soft tissue · 0.07mm/px · 11 acquisitions, 11 frames shown]
[im 1/11]
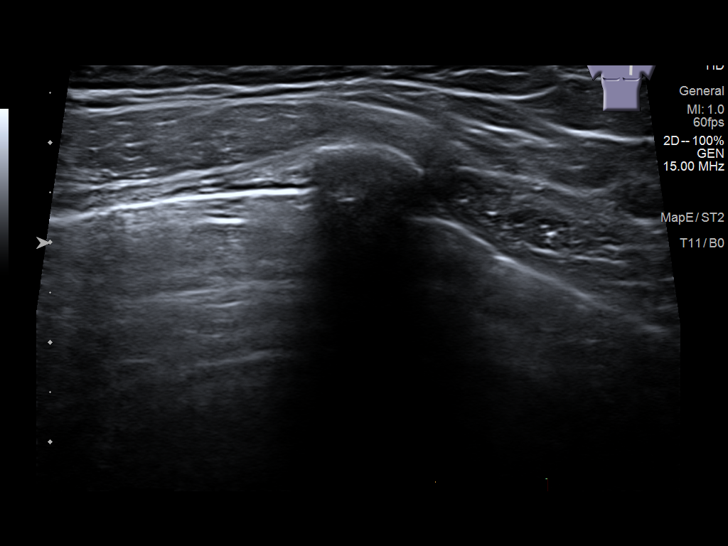
[im 2/11]
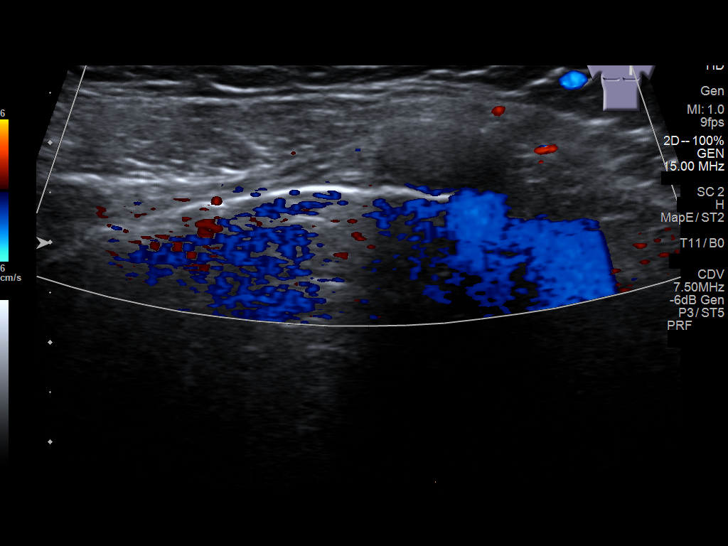
[im 3/11]
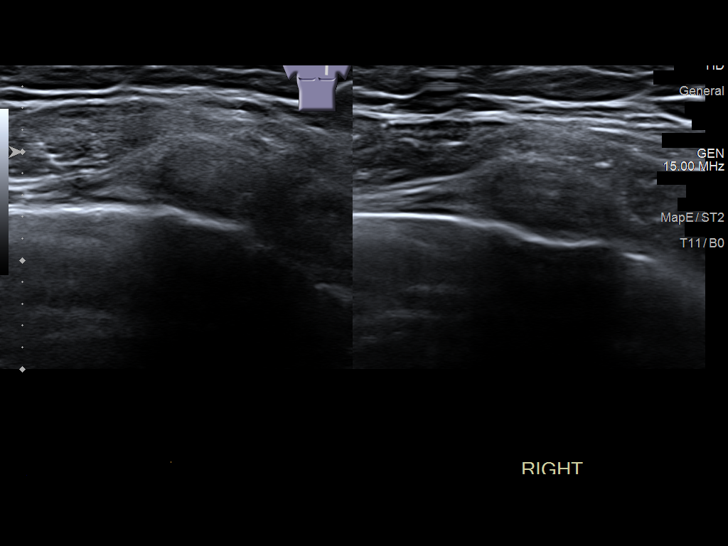
[im 4/11]
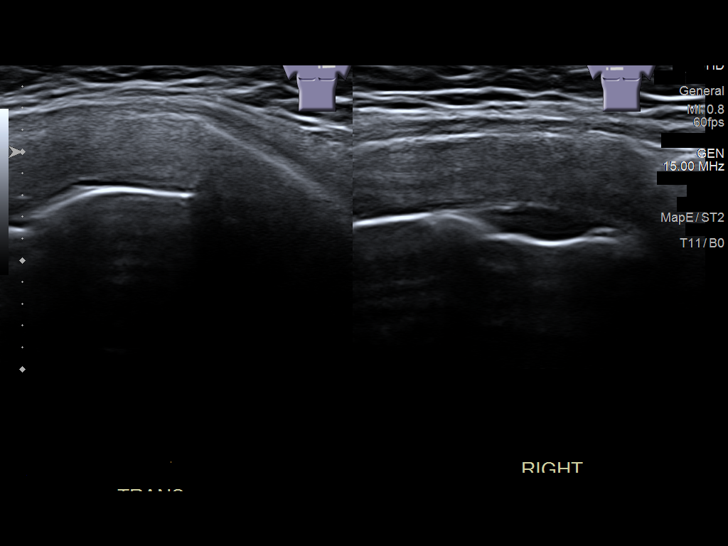
[im 5/11]
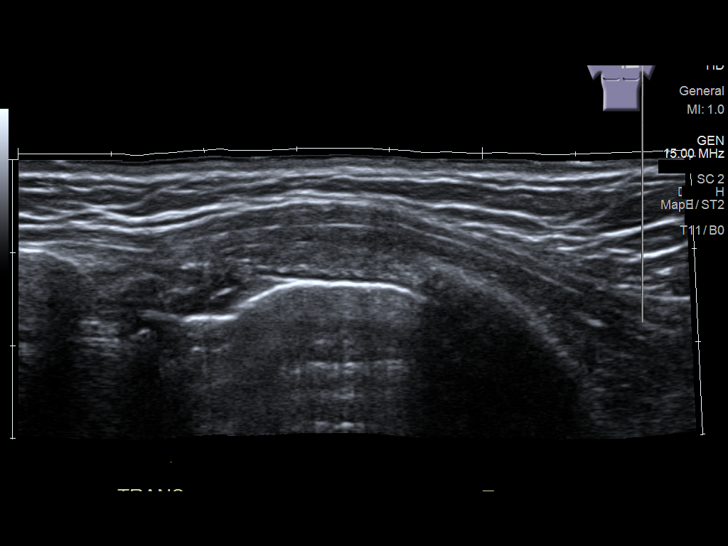
[im 6/11]
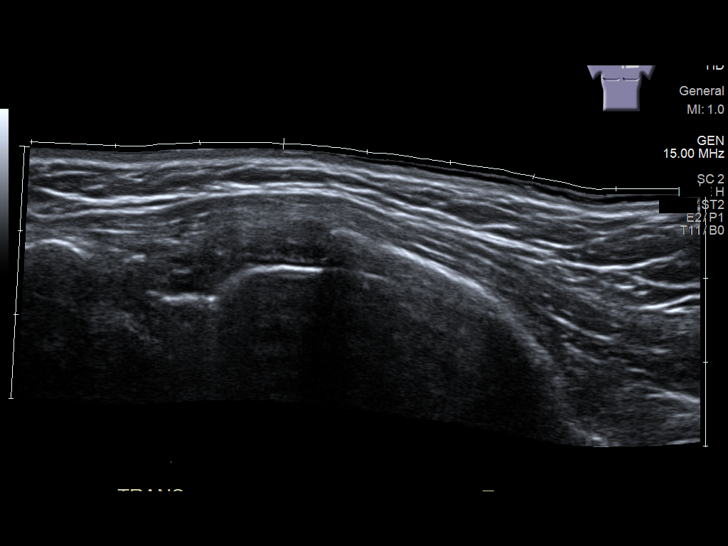
[im 7/11]
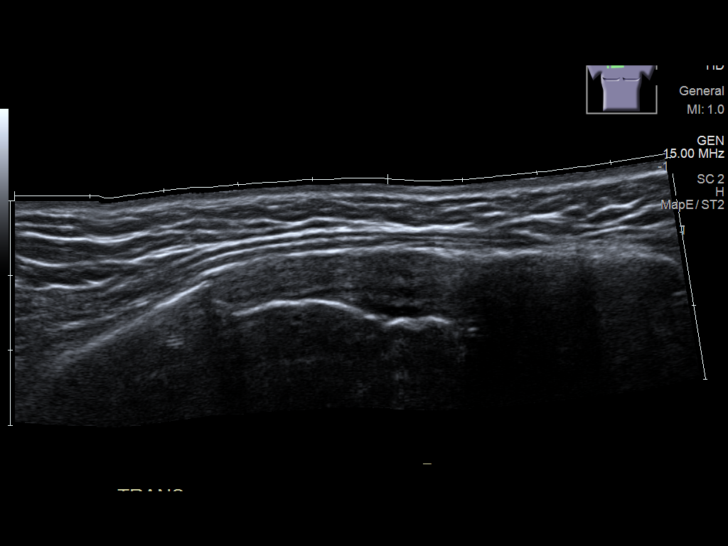
[im 8/11]
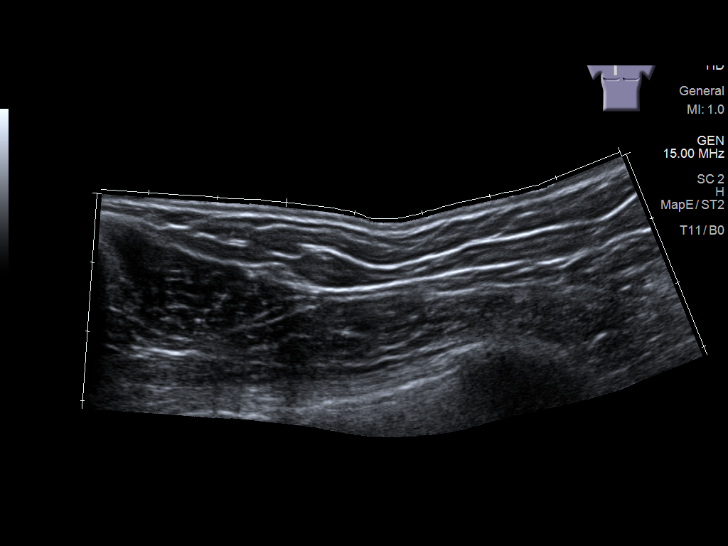
[im 9/11]
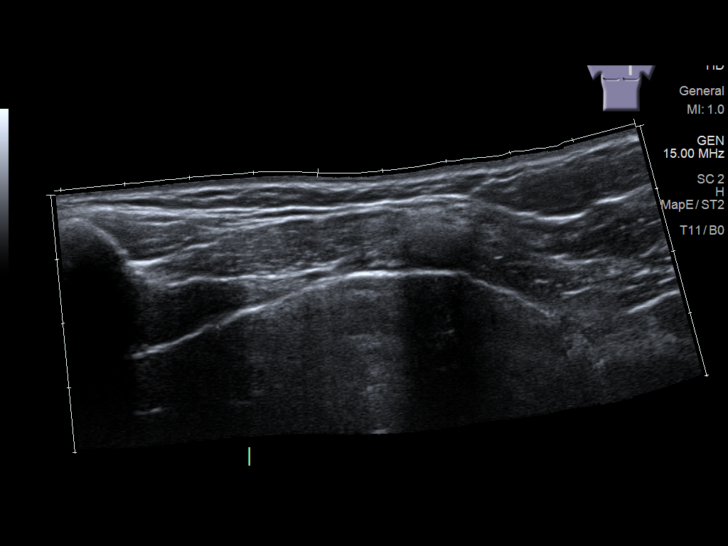
[im 10/11]
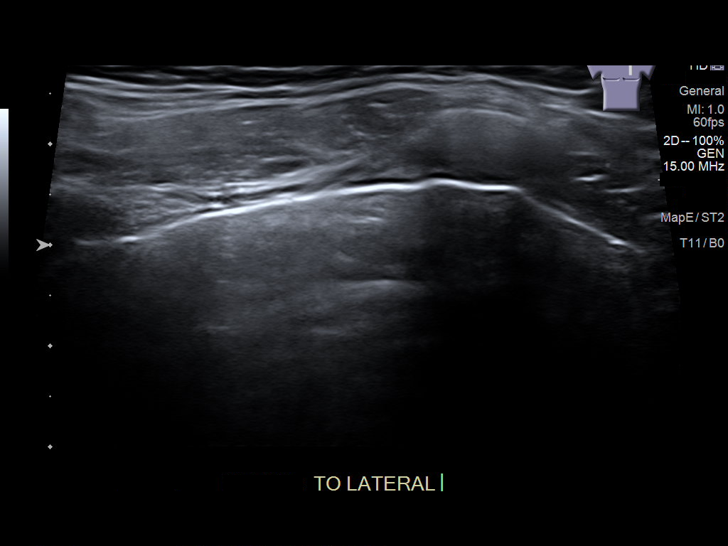
[im 11/11]
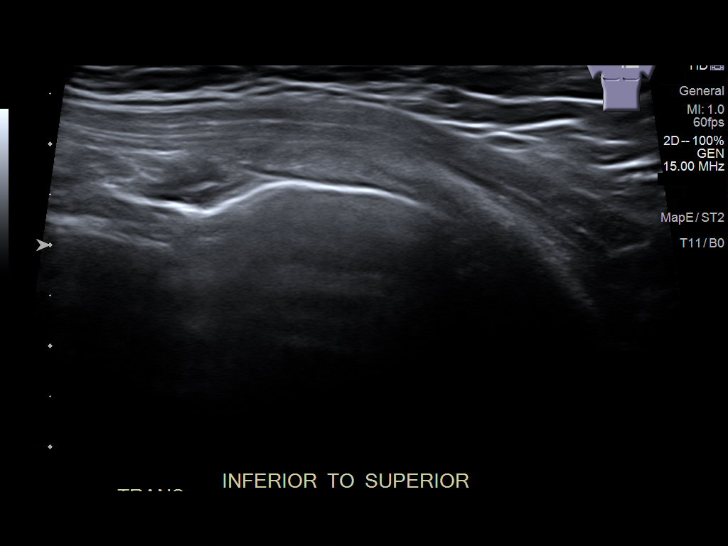

[11 of 11 positions shown; findings below may reference images not displayed]

FINDINGS: Targeted ultrasound examination of the anterior left chest wall at
the patient identified site of painful abnormality reveals no mass,
cyst, or other abnormality to explain pain. Normal appearing ribs
and overlying chest wall musculature are noted.
IMPRESSION: Negative targeted ultrasound examination of the anterior left chest
wall at the patient identified site of painful abnormality. No mass,
cyst, or other abnormality to explain pain. Consider CT or MRI to
further evaluate if there is high clinical concern for mass or
concerning clinical change.

## 2022-04-08 ENCOUNTER — Encounter: Payer: Self-pay | Admitting: Orthopedic Surgery

## 2022-04-08 ENCOUNTER — Emergency Department
Admission: EM | Admit: 2022-04-08 | Discharge: 2022-04-08 | Disposition: A | Discharge status: Home / Self Care | Payer: Medicaid Other | Attending: Emergency Medicine | Admitting: Emergency Medicine

## 2022-04-08 ENCOUNTER — Emergency Department: Payer: Medicaid Other

## 2022-04-08 ENCOUNTER — Other Ambulatory Visit: Payer: Self-pay

## 2022-04-08 ENCOUNTER — Other Ambulatory Visit: Payer: Self-pay | Admitting: Orthopedic Surgery

## 2022-04-08 ENCOUNTER — Other Ambulatory Visit: Payer: Self-pay | Admitting: Physician Assistant

## 2022-04-08 ENCOUNTER — Encounter: Payer: Self-pay | Admitting: Physician Assistant

## 2022-04-08 DIAGNOSIS — M79605 Pain in left leg: Secondary | ICD-10-CM

## 2022-04-08 DIAGNOSIS — M79662 Pain in left lower leg: Secondary | ICD-10-CM | POA: Diagnosis present

## 2022-04-08 DIAGNOSIS — M7981 Nontraumatic hematoma of soft tissue: Secondary | ICD-10-CM | POA: Insufficient documentation

## 2022-04-08 DIAGNOSIS — S8012XA Contusion of left lower leg, initial encounter: Secondary | ICD-10-CM

## 2022-04-08 NOTE — ED Notes (Signed)
Pt gone to US

## 2022-04-08 NOTE — ED Notes (Signed)
Pt also states she was seen at [facility] last night and they told ehr she just needed to do an ultrasound and they were unable to do so.

## 2022-04-08 NOTE — Discharge Instructions (Signed)
You can gently massage affected area with a warm compress. Alternate Tylenol and ibuprofen for pain.

## 2022-04-08 NOTE — ED Notes (Signed)
Pt verbalizes understanding of discharge instructions. Opportunity for questioning and answers were provided. Pt discharged from ED to home.   ? ?

## 2022-04-08 NOTE — ED Provider Notes (Signed)
[facility] Provider Note  Patient Contact: 4:06 PM (approximate)   History   Leg Pain (LEFT - bruised)   HPI  Karen Mack is a 27 y.o. female with a family history of clotting disorders, presents to the emergency department with new bruising along the left medial calf and worsening pain.  Patient denies recent traumas to her knowledge.  She denies recent surgery, prolonged immobilization, recent travel or daily smoking.  No pleuritic chest pain, chest tightness or shortness of breath.      Physical Exam   Triage Vital Signs: ED Triage Vitals  Enc Vitals Group     BP 04/08/22 1539 (!) 126/90     Pulse Rate 04/08/22 1539 79     Resp 04/08/22 1539 16     Temp 04/08/22 1539 98 F (36.7 C)     Temp Source 04/08/22 1539 Oral     SpO2 04/08/22 1539 98 %     Weight 04/08/22 1540 135 lb (61.2 kg)     Height 04/08/22 1540 5\' 2"  (1.575 m)     Head Circumference --      Peak Flow --      Pain Score 04/08/22 1540 0     Pain Loc --      Pain Edu? --      Excl. in [place]? --     Most recent vital signs: Vitals:   04/08/22 1539 04/08/22 1717  BP: (!) 126/90 125/85  Pulse: 79 75  Resp: 16 18  Temp: 98 F (36.7 C)   SpO2: 98% 99%     General: Alert and in no acute distress. Eyes:  PERRL. EOMI. Head: No acute traumatic findings ENT:      Nose: No congestion/rhinnorhea.      Mouth/Throat: Mucous membranes are moist. Neck: No stridor. No cervical spine tenderness to palpation. Cardiovascular:  Good peripheral perfusion Respiratory: Normal respiratory effort without tachypnea or retractions. Lungs CTAB. Good air entry to the bases with no decreased or absent breath sounds. Gastrointestinal: Bowel sounds 4 quadrants. Soft and nontender to palpation. No guarding or rigidity. No palpable masses. No distention. No CVA tenderness. Musculoskeletal: Full range of motion to all extremities.  Patient has bruising along the inner aspect of her left calf.  Palpable  dorsalis pedis pulse bilaterally and symmetrically.  Capillary refill less than 2 seconds on the left. Neurologic:  No gross focal neurologic deficits are appreciated.  Other:   ED Results / Procedures / Treatments   Labs (all labs ordered are listed, but only abnormal results are displayed) Labs Reviewed - No data to display      RADIOLOGY  I personally viewed and evaluated these images as part of my medical decision making, as well as reviewing the written report by the radiologist.  ED Provider Interpretation: No acute abnormality on venous ultrasound   PROCEDURES:  Critical Care performed: No  Procedures   MEDICATIONS ORDERED IN ED: Medications - No data to display   IMPRESSION / MDM / [place] / ED COURSE  I reviewed the triage vital signs and the nursing notes.                              Assessment and plan Leg swelling 27 year old female presents to the emergency department with bruising along the inner aspect of her left lower extremity.  Venous ultrasound shows no signs of DVT.  Supportive measures were encouraged  at home.  Return precautions were given to return with new or worsening symptoms.  FINAL CLINICAL IMPRESSION(S) / ED DIAGNOSES   Final diagnoses:  Left leg pain     Rx / DC Orders   ED Discharge Orders     None        Note:  This document was prepared using Dragon voice recognition software and may include unintentional dictation errors.   Vallarie Mare [place], PA-C 04/08/22 1800    Lavonia Drafts, MD 04/08/22 [phone]

## 2022-04-08 NOTE — ED Notes (Signed)
Bruise noted to inner aspect of left lower leg. Pt denies injury or trauma. Is concerned for a blood clot and is here for DVT study

## 2022-04-08 NOTE — ED Triage Notes (Signed)
Pt to ED from home for bruise on her left inner leg that she noticed yesterday morning. Pt denies any falls or trauma to her leg. Pt denies being  a smoker and on any borth control or blood thinners at this time. Pt is CAOx4 and ambulatory in triage. Pt advised her mother is a high risk factor for blood clots so she thought she might need to be checked out for same. Pt leg is not red, not swollen and not hot to the touch.

## 2022-04-14 ENCOUNTER — Ambulatory Visit: Payer: Medicaid Other

## 2022-10-09 ENCOUNTER — Encounter: Payer: Self-pay | Admitting: Emergency Medicine

## 2022-10-09 ENCOUNTER — Emergency Department: Payer: Medicaid Other

## 2022-10-09 ENCOUNTER — Emergency Department
Admission: EM | Admit: 2022-10-09 | Discharge: 2022-10-09 | Disposition: A | Discharge status: Home / Self Care | Payer: Medicaid Other | Attending: Emergency Medicine | Admitting: Emergency Medicine

## 2022-10-09 ENCOUNTER — Other Ambulatory Visit: Payer: Self-pay

## 2022-10-09 DIAGNOSIS — R109 Unspecified abdominal pain: Secondary | ICD-10-CM | POA: Diagnosis present

## 2022-10-09 DIAGNOSIS — R102 Pelvic and perineal pain: Secondary | ICD-10-CM

## 2022-10-09 DIAGNOSIS — R110 Nausea: Secondary | ICD-10-CM | POA: Diagnosis not present

## 2022-10-09 LAB — COMPREHENSIVE METABOLIC PANEL
ALT: 13 U/L (ref 0–44)
AST: 18 U/L (ref 15–41)
Albumin: 4.1 g/dL (ref 3.5–5.0)
Alkaline Phosphatase: 80 U/L (ref 38–126)
Anion gap: 7 (ref 5–15)
BUN: 16 mg/dL (ref 6–20)
CO2: 25 mmol/L (ref 22–32)
Calcium: 9.2 mg/dL (ref 8.9–10.3)
Chloride: 108 mmol/L (ref 98–111)
Creatinine, Ser: 0.68 mg/dL (ref 0.44–1.00)
GFR, Estimated: >60 mL/min (ref >60)
Glucose, Bld: 76 mg/dL (ref 70–99)
Potassium: 4 mmol/L (ref 3.5–5.1)
Sodium: 140 mmol/L (ref 135–145)
Total Bilirubin: 0.4 mg/dL (ref 0.3–1.2)
Total Protein: 7.5 g/dL (ref 6.5–8.1)

## 2022-10-09 LAB — CBC
HCT: 36.6 % (ref 36.0–46.0)
Hemoglobin: 12.2 g/dL (ref 12.0–15.0)
MCH: 27.5 pg (ref 26.0–34.0)
MCHC: 33.3 g/dL (ref 30.0–36.0)
MCV: 82.4 fL (ref 80.0–100.0)
Platelets: 233 10*3/uL (ref 150–400)
RBC: 4.44 MIL/uL (ref 3.87–5.11)
RDW: 13.5 % (ref 11.5–15.5)
WBC: 5.5 10*3/uL (ref 4.0–10.5)
nRBC: 0 % (ref 0.0–0.2)

## 2022-10-09 LAB — URINALYSIS, ROUTINE W REFLEX MICROSCOPIC
Bacteria, UA: NONE SEEN
Bilirubin Urine: NEGATIVE
Glucose, UA: NEGATIVE mg/dL
Ketones, ur: NEGATIVE mg/dL
Nitrite: NEGATIVE
Protein, ur: NEGATIVE mg/dL
Specific Gravity, Urine: 1.017 (ref 1.005–1.030)
pH: 7 (ref 5.0–8.0)

## 2022-10-09 LAB — POC URINE PREG, ED: Preg Test, Ur: NEGATIVE

## 2022-10-09 LAB — LIPASE, BLOOD: Lipase: 53 U/L — ABNORMAL HIGH (ref 11–51)

## 2022-10-09 MED ORDER — HYOSCYAMINE SULFATE 0.125 MG SL SUBL
0.1250 mg | SUBLINGUAL_TABLET | SUBLINGUAL | 0 refills | Status: AC | PRN
Start: 2022-10-09 — End: ?

## 2022-10-09 MED ORDER — DICYCLOMINE HCL 10 MG PO CAPS: 10.0000 mg | ORAL_CAPSULE | Freq: Three times a day (TID) | ORAL | 0 refills | Status: DC

## 2022-10-09 MED ORDER — IOHEXOL 300 MG/ML  SOLN
100.0000 mL | Freq: Once | INTRAMUSCULAR | Status: AC | PRN
  Administered 2022-10-09: 100 mL via INTRAVENOUS

## 2022-10-09 NOTE — Discharge Instructions (Addendum)
Do not pick up the Bentyl.  I have tried to call to request they not fill it, but no one answers at [facility].  The Levsin is safe to take even during breast feeding.

## 2022-10-09 NOTE — ED Provider Notes (Signed)
[facility] Provider Note    Event Date/Time   First MD Initiated Contact with Patient 10/09/22 1019     (approximate)   History   Abdominal Pain   HPI  Karen Mack is a 27 y.o. female  with no significant past medical history and as listed in EMR presents to the emergency department for evaluation of abdominal pain for more than a month. She has also had some nausea, but no vomiting or diarrhea. She has been working with her GYN to find out the source of her pain. They have advised her she has some small cysts on her ovaries, but otherwise no reason found for persistent pain. She describes it as crampy. No fever.      Physical Exam   Triage Vital Signs: ED Triage Vitals  Encounter Vitals Group     BP 10/09/22 1009 125/80     Systolic BP Percentile --      Diastolic BP Percentile --      Pulse Rate 10/09/22 1009 78     Resp 10/09/22 1009 16     Temp 10/09/22 1009 98.4 F (36.9 C)     Temp src --      SpO2 10/09/22 1009 100 %     Weight 10/09/22 1007 135 lb (61.2 kg)     Height 10/09/22 1007 5\' 2"  (1.575 m)     Head Circumference --      Peak Flow --      Pain Score 10/09/22 1007 4     Pain Loc --      Pain Education --      Exclude from Growth Chart --     Most recent vital signs: Vitals:   10/09/22 1009 10/09/22 1249  BP: 125/80 125/78  Pulse: 78 80  Resp: 16 16  Temp: 98.4 F (36.9 C)   SpO2: 100% 99%    General: Awake, no distress.  CV:  Good peripheral perfusion.  Resp:  Normal effort.  Abd:  No distention. Soft, no guarding or rebound tenderness. Other:     ED Results / Procedures / Treatments   Labs (all labs ordered are listed, but only abnormal results are displayed) Labs Reviewed  LIPASE, BLOOD - Abnormal; Notable for the following components:      Result Value   Lipase 53 (*)    All other components within normal limits  URINALYSIS, ROUTINE W REFLEX MICROSCOPIC - Abnormal; Notable for the following components:    Color, Urine YELLOW (*)    APPearance CLEAR (*)    Hgb urine dipstick SMALL (*)    Leukocytes,Ua TRACE (*)    All other components within normal limits  COMPREHENSIVE METABOLIC PANEL  CBC  POC URINE PREG, ED     EKG  Not indicated.   RADIOLOGY  Image and radiology report reviewed and interpreted by me. Radiology report consistent with the same.  No acute or inflammatory  process per CT.  PROCEDURES:  Critical Care performed: No  Procedures   MEDICATIONS ORDERED IN ED:  Medications  iohexol (OMNIPAQUE) 300 MG/ML solution 100 mL (100 mLs Intravenous Contrast Given 10/09/22 1137)     IMPRESSION / MDM / ASSESSMENT AND PLAN / ED COURSE   I have reviewed the triage note.  Differential diagnosis includes, but is not limited to, colitis, pancreatitis, diverticulitis, adenitis, constipation, appendicitis, gallstones.  Patient's presentation is most consistent with acute complicated illness / injury requiring diagnostic workup.  27 year old presents with abdominal  pain x 1 month.  CT without acute findings.  Labs reassuring with exception of lipase with is mildly elevated at 53.   Results discussed with patient. Plan will be to treat her pain and nausea with Levsin and have her follow up with GI, primary care, or gynecologist.      FINAL CLINICAL IMPRESSION(S) / ED DIAGNOSES   Final diagnoses:  Pelvic pain in female     Rx / DC Orders   ED Discharge Orders          Ordered    dicyclomine (BENTYL) 10 MG capsule  3 times daily before meals & bedtime,   Status:  Discontinued        10/09/22 1229    hyoscyamine (LEVSIN/SL) 0.125 MG SL tablet  Every 4 hours PRN        10/09/22 1235             Note:  This document was prepared using Dragon voice recognition software and may include unintentional dictation errors.   Chinita Pester, FNP 10/09/22 1541    Jene Every, MD 10/10/22 1510

## 2022-10-09 NOTE — ED Triage Notes (Signed)
Pt states coming in with abdominal pain for over a month. Pt states possible ovarian cyst. Pt states some nausea, but denies vomiting and diarrhea.

## 2022-10-09 NOTE — ED Notes (Signed)
See triage note  Presents with abd pain  states the pain has been going on for about 1 month  But is now localized to right lateral abd  Denies any n/v/d/ or fever no urinary sxs  Has been seen by her PCP   states she has had an u/s and placed on BCP

## 2022-12-21 ENCOUNTER — Emergency Department
Admission: EM | Admit: 2022-12-21 | Discharge: 2022-12-21 | Disposition: A | Discharge status: Home / Self Care | Payer: Medicaid Other | Attending: Emergency Medicine | Admitting: Emergency Medicine

## 2022-12-21 ENCOUNTER — Emergency Department: Payer: Medicaid Other

## 2022-12-21 ENCOUNTER — Other Ambulatory Visit: Payer: Self-pay

## 2022-12-21 DIAGNOSIS — N939 Abnormal uterine and vaginal bleeding, unspecified: Secondary | ICD-10-CM | POA: Insufficient documentation

## 2022-12-21 DIAGNOSIS — R102 Pelvic and perineal pain: Secondary | ICD-10-CM | POA: Diagnosis present

## 2022-12-21 LAB — CBC WITH DIFFERENTIAL/PLATELET
Abs Immature Granulocytes: 0.02 10*3/uL (ref 0.00–0.07)
Basophils Absolute: 0.1 10*3/uL (ref 0.0–0.1)
Basophils Relative: 1 %
Eosinophils Absolute: 0.2 10*3/uL (ref 0.0–0.5)
Eosinophils Relative: 2 %
HCT: 39.1 % (ref 36.0–46.0)
Hemoglobin: 13 g/dL (ref 12.0–15.0)
Immature Granulocytes: 0 %
Lymphocytes Relative: 21 %
Lymphs Abs: 1.7 10*3/uL (ref 0.7–4.0)
MCH: 27 pg (ref 26.0–34.0)
MCHC: 33.2 g/dL (ref 30.0–36.0)
MCV: 81.1 fL (ref 80.0–100.0)
Monocytes Absolute: 0.3 10*3/uL (ref 0.1–1.0)
Monocytes Relative: 4 %
Neutro Abs: 5.9 10*3/uL (ref 1.7–7.7)
Neutrophils Relative %: 72 %
Platelets: 293 10*3/uL (ref 150–400)
RBC: 4.82 MIL/uL (ref 3.87–5.11)
RDW: 13 % (ref 11.5–15.5)
WBC: 8.2 10*3/uL (ref 4.0–10.5)
nRBC: 0 % (ref 0.0–0.2)

## 2022-12-21 LAB — URINALYSIS, ROUTINE W REFLEX MICROSCOPIC
Bilirubin Urine: NEGATIVE
Glucose, UA: NEGATIVE mg/dL
Hgb urine dipstick: NEGATIVE
Ketones, ur: NEGATIVE mg/dL
Leukocytes,Ua: NEGATIVE
Nitrite: NEGATIVE
Protein, ur: NEGATIVE mg/dL
Specific Gravity, Urine: 1.016 (ref 1.005–1.030)
pH: 7 (ref 5.0–8.0)

## 2022-12-21 LAB — COMPREHENSIVE METABOLIC PANEL
ALT: 15 U/L (ref 0–44)
AST: 19 U/L (ref 15–41)
Albumin: 4.6 g/dL (ref 3.5–5.0)
Alkaline Phosphatase: 98 U/L (ref 38–126)
Anion gap: 7 (ref 5–15)
BUN: 14 mg/dL (ref 6–20)
CO2: 27 mmol/L (ref 22–32)
Calcium: 9 mg/dL (ref 8.9–10.3)
Chloride: 104 mmol/L (ref 98–111)
Creatinine, Ser: 0.64 mg/dL (ref 0.44–1.00)
GFR, Estimated: >60 mL/min (ref >60)
Glucose, Bld: 83 mg/dL (ref 70–99)
Potassium: 3.9 mmol/L (ref 3.5–5.1)
Sodium: 138 mmol/L (ref 135–145)
Total Bilirubin: 0.5 mg/dL (ref 0.3–1.2)
Total Protein: 8.1 g/dL (ref 6.5–8.1)

## 2022-12-21 LAB — POC URINE PREG, ED: Preg Test, Ur: NEGATIVE

## 2022-12-21 LAB — WET PREP, GENITAL
Clue Cells Wet Prep HPF POC: NONE SEEN
Sperm: NONE SEEN
Trich, Wet Prep: NONE SEEN
WBC, Wet Prep HPF POC: <10 (ref <10)
Yeast Wet Prep HPF POC: NONE SEEN

## 2022-12-21 LAB — CHLAMYDIA/NGC RT PCR (ARMC ONLY)
Chlamydia Tr: NOT DETECTED
N gonorrhoeae: NOT DETECTED

## 2022-12-21 LAB — LIPASE, BLOOD: Lipase: 43 U/L (ref 11–51)

## 2022-12-21 NOTE — ED Provider Notes (Signed)
[facility] Provider Note    Event Date/Time   First MD Initiated Contact with Patient 12/21/22 1353     (approximate)   History   Pelvic Pain   HPI  Karen Mack is a 27 y.o. 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  Patient reports that this has been ongoing for approximately 6 months.  She denies nausea, vomiting, diarrhea.  Today she began to have some spotting which is what prompted her to come in for evaluation.  She reports that she has seen her OB/GYN in the past.  She reports that the pain is across her low abdomen, and is not unilateral.  She reports that she is sexually active with 1 female partner and they use condoms.  She denies having any other birth control.  No fevers or chills.  No history of STD.  No vaginal discharge.  There are no problems to display for this patient.         Physical Exam   Triage Vital Signs: ED Triage Vitals [12/21/22 1215]  Encounter Vitals Group     BP      Systolic BP Percentile      Diastolic BP Percentile      Pulse      Resp      Temp      Temp src      SpO2      Weight 134 lb 7.7 oz (61 kg)     Height 5\' 2"  (1.575 m)     Head Circumference      Peak Flow      Pain Score 6     Pain Loc      Pain Education      Exclude from Growth Chart     Most recent vital signs: Vitals:   12/21/22 1530 12/21/22 1828  BP: 100/61 110/65  Pulse: 73 70  Resp: 18 18  Temp:    SpO2: 100% 98%    Physical Exam Vitals and nursing note reviewed.  Constitutional:      General: Awake and alert. No acute distress.    Appearance: Normal appearance. The patient is normal weight.  HENT:     Head: Normocephalic and atraumatic.     Mouth: Mucous membranes are moist.  Eyes:     General: PERRL. Normal EOMs        Right eye: No discharge.        Left eye: No discharge.     Conjunctiva/sclera: Conjunctivae normal.  Cardiovascular:     Rate and  Rhythm: Normal rate and regular rhythm.     Pulses: Normal pulses.  Pulmonary:     Effort: Pulmonary effort is normal. No respiratory distress.     Breath sounds: Normal breath sounds.  Abdominal:     Abdomen is soft. There is no abdominal tenderness. No rebound or guarding. No distention. Musculoskeletal:        General: No swelling. Normal range of motion.     Cervical back: Normal range of motion and neck supple.  Skin:    General: Skin is warm and dry.     Capillary Refill: Capillary refill takes less than 2 seconds.     Findings: No rash.  Neurological:     Mental Status: The patient is awake and alert.      ED Results / Procedures / Treatments   Labs (all labs ordered are listed, but  only abnormal results are displayed) Labs Reviewed  URINALYSIS, ROUTINE W REFLEX MICROSCOPIC - Abnormal; Notable for the following components:      Result Value   Color, Urine YELLOW (*)    APPearance CLEAR (*)    All other components within normal limits  WET PREP, GENITAL  CHLAMYDIA/NGC RT PCR ([facility] ONLY)            CBC WITH DIFFERENTIAL/PLATELET  COMPREHENSIVE METABOLIC PANEL  LIPASE, BLOOD  POC URINE PREG, ED     EKG     RADIOLOGY I independently reviewed and interpreted imaging and agree with radiologists findings.     PROCEDURES:  Critical Care performed:   Procedures   MEDICATIONS ORDERED IN ED: Medications - No data to display   IMPRESSION / MDM / ASSESSMENT AND PLAN / ED COURSE  I reviewed the triage vital signs and the nursing notes.   Differential diagnosis includes, but is not limited to, infection, fibroids, cysts, endometriosis, urinary tract infection, sexually transmitted disease.  I reviewed the patient's chart.  Patient was seen in the emergency department on 10/09/2022 with the same complaint.  She had a CAT scan at that time which was overall reassuring.  She also saw OB/GYN on 09/27/2022.  She had a normal pelvic exam at that time.  Patient is  awake and alert, hemodynamically stable and afebrile.  She is nontoxic in appearance.  Labs obtained are overall reassuring.  GC chlamydia and wet prep obtained are normal.  Urinalysis does not reveal any evidence of UTI.  Pelvic ultrasound was normal.  Given the duration of her symptoms of several months, I do not feel that this is an emergent process today.  I recommended that she follow-up with OB/GYN.  She was given the appropriate follow-up information as she requests a new OB/GYN.  In the meantime, we discussed strict return precautions.  Patient understands and agrees with plan.  She was discharged in stable condition.  Patient's presentation is most consistent with acute complicated illness / injury requiring diagnostic workup.      FINAL CLINICAL IMPRESSION(S) / ED DIAGNOSES   Final diagnoses:  Pelvic pain in female     Rx / DC Orders   ED Discharge Orders     None        Note:  This document was prepared using Dragon voice recognition software and may include unintentional dictation errors.   Jackelyn Hoehn, PA-C 12/21/22 1847    Janith Lima, MD 12/24/22 [phone]

## 2022-12-21 NOTE — ED Triage Notes (Signed)
Pt presents to Ed with /co of pelvic pain for the past 6 months, pt states more pain today. NAD noted.

## 2022-12-21 NOTE — Discharge Instructions (Signed)
Your blood work, gonorrhea, chlamydia, wet prep, urinalysis, and ultrasound were normal.  Please follow-up with OB/GYN.  Please return for any new, worsening, or change in symptoms or other concerns.  It was a pleasure caring for you today.

## 2022-12-21 NOTE — ED Notes (Signed)
Pt verbalizes understanding of discharge instructions. Opportunity for questioning and answers were provided. Pt discharged from ED to home.   ? ?

## 2023-09-28 ENCOUNTER — Emergency Department
Admission: EM | Admit: 2023-09-28 | Discharge: 2023-09-28 | Disposition: A | Discharge status: Home / Self Care | Source: Other Acute Inpatient Hospital | Attending: Emergency Medicine | Admitting: Emergency Medicine

## 2023-09-28 ENCOUNTER — Other Ambulatory Visit: Payer: Self-pay

## 2023-09-28 ENCOUNTER — Encounter: Payer: Self-pay | Admitting: Emergency Medicine

## 2023-09-28 DIAGNOSIS — R8271 Bacteriuria: Secondary | ICD-10-CM | POA: Insufficient documentation

## 2023-09-28 DIAGNOSIS — O210 Mild hyperemesis gravidarum: Secondary | ICD-10-CM | POA: Diagnosis present

## 2023-09-28 DIAGNOSIS — Z3A09 9 weeks gestation of pregnancy: Secondary | ICD-10-CM | POA: Insufficient documentation

## 2023-09-28 DIAGNOSIS — O99891 Other specified diseases and conditions complicating pregnancy: Secondary | ICD-10-CM

## 2023-09-28 HISTORY — DX: Unspecified asthma, uncomplicated: J45.909

## 2023-09-28 HISTORY — DX: Endometriosis, unspecified: N80.9

## 2023-09-28 HISTORY — DX: Postural orthostatic tachycardia syndrome (POTS): G90.A

## 2023-09-28 LAB — CBC
HCT: 37.8 % (ref 36.0–46.0)
Hemoglobin: 12.9 g/dL (ref 12.0–15.0)
MCH: 28.2 pg (ref 26.0–34.0)
MCHC: 34.1 g/dL (ref 30.0–36.0)
MCV: 82.7 fL (ref 80.0–100.0)
Platelets: 219 K/uL (ref 150–400)
RBC: 4.57 MIL/uL (ref 3.87–5.11)
RDW: 13.8 % (ref 11.5–15.5)
WBC: 8.2 K/uL (ref 4.0–10.5)
nRBC: 0 % (ref 0.0–0.2)

## 2023-09-28 LAB — URINALYSIS, ROUTINE W REFLEX MICROSCOPIC
Bilirubin Urine: NEGATIVE
Glucose, UA: NEGATIVE mg/dL
Hgb urine dipstick: NEGATIVE
Ketones, ur: 20 mg/dL — AB
Nitrite: NEGATIVE
Protein, ur: NEGATIVE mg/dL
Specific Gravity, Urine: 1.031 — ABNORMAL HIGH (ref 1.005–1.030)
pH: 5 (ref 5.0–8.0)

## 2023-09-28 LAB — COMPREHENSIVE METABOLIC PANEL WITH GFR
ALT: 12 U/L (ref 0–44)
AST: 16 U/L (ref 15–41)
Albumin: 3.8 g/dL (ref 3.5–5.0)
Alkaline Phosphatase: 57 U/L (ref 38–126)
Anion gap: 8 (ref 5–15)
BUN: 14 mg/dL (ref 6–20)
CO2: 24 mmol/L (ref 22–32)
Calcium: 9.3 mg/dL (ref 8.9–10.3)
Chloride: 106 mmol/L (ref 98–111)
Creatinine, Ser: 0.66 mg/dL (ref 0.44–1.00)
GFR, Estimated: >60 mL/min (ref >60)
Glucose, Bld: 79 mg/dL (ref 70–99)
Potassium: 4 mmol/L (ref 3.5–5.1)
Sodium: 138 mmol/L (ref 135–145)
Total Bilirubin: 0.4 mg/dL (ref 0.0–1.2)
Total Protein: 7.1 g/dL (ref 6.5–8.1)

## 2023-09-28 LAB — LIPASE, BLOOD: Lipase: 35 U/L (ref 11–51)

## 2023-09-28 LAB — HCG, QUANTITATIVE, PREGNANCY: hCG, Beta Chain, Quant, S: 89086 m[IU]/mL — ABNORMAL HIGH (ref <5)

## 2023-09-28 LAB — POC URINE PREG, ED: Preg Test, Ur: POSITIVE — AB

## 2023-09-28 MED ORDER — LACTATED RINGERS IV BOLUS
1000.0000 mL | Freq: Once | INTRAVENOUS | Status: AC
  Administered 2023-09-28: 1000 mL via INTRAVENOUS

## 2023-09-28 MED ORDER — METOCLOPRAMIDE HCL 10 MG PO TABS: 10.0000 mg | ORAL_TABLET | Freq: Three times a day (TID) | ORAL | 0 refills | Status: AC

## 2023-09-28 MED ORDER — METOCLOPRAMIDE HCL 5 MG/ML IJ SOLN
10.0000 mg | Freq: Once | INTRAMUSCULAR | Status: AC
  Administered 2023-09-28: 10 mg via INTRAVENOUS
  Filled 2023-09-28: qty 2

## 2023-09-28 MED ORDER — CEPHALEXIN 500 MG PO CAPS
500.0000 mg | ORAL_CAPSULE | Freq: Three times a day (TID) | ORAL | 0 refills | Status: AC
Start: 2023-09-28 — End: 2023-10-03

## 2023-09-28 NOTE — Discharge Instructions (Signed)
 Take the prescription meds as directed.  Follow-up with your [facility] provider as scheduled.  Return to the ED if needed.

## 2023-09-28 NOTE — ED Triage Notes (Signed)
 Patient to ED via POV for emesis with pregnancy- currently 9 weeks. Unable to keep anything down for the past 3-4 days. Taking zofran  at home with no relief.

## 2023-09-28 NOTE — ED Provider Notes (Signed)
 [facility] Emergency Department Provider Note     Event Date/Time   First MD Initiated Contact with Patient 09/28/23 1216     (approximate)   History   Emesis During Pregnancy   HPI  Karen Mack is a 28 y.o. female G2P0 with a history of POTS, endometriosis, NAFLD, presents to the ED reporting current gestation of approximately 9 weeks.  Patient has been receiving prenatal care via the [facility] health system, according to chart review.  She endorses 3 days of intermittent emesis.  She denies any benefit with current prescription with Zofran .  She denies any abnormal bleeding, cramping, or spotting.   Physical Exam   Triage Vital Signs: ED Triage Vitals  Encounter Vitals Group     BP 09/28/23 1105 112/81     Girls Systolic BP Percentile --      Girls Diastolic BP Percentile --      Boys Systolic BP Percentile --      Boys Diastolic BP Percentile --      Pulse Rate 09/28/23 1105 79     Resp 09/28/23 1105 17     Temp 09/28/23 1105 98.2 F (36.8 C)     Temp Source 09/28/23 1105 Oral     SpO2 09/28/23 1105 100 %     Weight 09/28/23 1103 140 lb (63.5 kg)     Height 09/28/23 1103 5' 2 (1.575 m)     Head Circumference --      Peak Flow --      Pain Score 09/28/23 1103 0     Pain Loc --      Pain Education --      Exclude from Growth Chart --     Most recent vital signs: Vitals:   09/28/23 1105 09/28/23 1203  BP: 112/81   Pulse: 79   Resp: 17   Temp: 98.2 F (36.8 C)   SpO2: 100% 100%    General Awake, no distress. NAD HEENT NCAT. PERRL. EOMI. No rhinorrhea. Mucous membranes are moist.  CV:  Good peripheral perfusion. RRR RESP:  Normal effort. CTA ABD:  No distention.    ED Results / Procedures / Treatments   Labs (all labs ordered are listed, but only abnormal results are displayed) Labs Reviewed  URINALYSIS, ROUTINE W REFLEX MICROSCOPIC - Abnormal; Notable for the following components:      Result Value   Color, Urine YELLOW (*)     APPearance HAZY (*)    Specific Gravity, Urine 1.031 (*)    Ketones, ur 20 (*)    Leukocytes,Ua SMALL (*)    Bacteria, UA RARE (*)    All other components within normal limits  HCG, QUANTITATIVE, PREGNANCY - Abnormal; Notable for the following components:   hCG, Beta Chain, Quant, S 89,086 (*)    All other components within normal limits  POC URINE PREG, ED - Abnormal; Notable for the following components:   Preg Test, Ur Positive (*)    All other components within normal limits  LIPASE, BLOOD  COMPREHENSIVE METABOLIC PANEL WITH GFR  CBC     EKG   RADIOLOGY   No results found.   PROCEDURES:  Critical Care performed: No  Procedures   MEDICATIONS ORDERED IN ED: Medications  lactated ringers  bolus 1,000 mL (1,000 mLs Intravenous New Bag/Given 09/28/23 1423)  metoCLOPramide  (REGLAN ) injection 10 mg (10 mg Intravenous Given 09/28/23 1423)     IMPRESSION / MDM / ASSESSMENT AND PLAN / ED COURSE  I  reviewed the triage vital signs and the nursing notes.                              Differential diagnosis includes, but is not limited to, hyperemesis, UTI, viral gastroenteritis, electrolyte abnormality, dehydration  Patient's presentation is most consistent with acute complicated illness / injury requiring diagnostic workup.  Patient's diagnosis is consistent with hyperemesis gravidarum.  Gravid patient with reassuring exam and workup at this time.  Patient with fibroblast hCG.  Other labs are unremarkable.  No evidence of electrolyte abnormality, critical anemia, leukocytosis.  UA does show some mild bacteriuria.  Patient will be discharged home with prescriptions for metoclopramide  and Keflex . Patient is to follow up with her OB provider as scheduled, as needed or otherwise directed. Patient is given ED precautions to return to the ED for any worsening or new symptoms.   FINAL CLINICAL IMPRESSION(S) / ED DIAGNOSES   Final diagnoses:  Hyperemesis gravidarum   Bacteriuria during pregnancy in first trimester     Rx / DC Orders   ED Discharge Orders     None        Note:  This document was prepared using Dragon voice recognition software and may include unintentional dictation errors.    Loyd Candida LULLA [place], PA-C 09/28/23 1538    Jacolyn Pae, MD 09/28/23 1930

## 2023-10-05 ENCOUNTER — Emergency Department
Admission: EM | Admit: 2023-10-05 | Discharge: 2023-10-05 | Disposition: A | Discharge status: Home / Self Care | Attending: Emergency Medicine | Admitting: Emergency Medicine

## 2023-10-05 ENCOUNTER — Emergency Department

## 2023-10-05 ENCOUNTER — Other Ambulatory Visit: Payer: Self-pay

## 2023-10-05 DIAGNOSIS — E860 Dehydration: Secondary | ICD-10-CM | POA: Insufficient documentation

## 2023-10-05 DIAGNOSIS — O99281 Endocrine, nutritional and metabolic diseases complicating pregnancy, first trimester: Secondary | ICD-10-CM | POA: Diagnosis not present

## 2023-10-05 DIAGNOSIS — Z3A11 11 weeks gestation of pregnancy: Secondary | ICD-10-CM | POA: Insufficient documentation

## 2023-10-05 DIAGNOSIS — O219 Vomiting of pregnancy, unspecified: Secondary | ICD-10-CM | POA: Insufficient documentation

## 2023-10-05 LAB — CBC
HCT: 37 % (ref 36.0–46.0)
Hemoglobin: 12.2 g/dL (ref 12.0–15.0)
MCH: 27.2 pg (ref 26.0–34.0)
MCHC: 33 g/dL (ref 30.0–36.0)
MCV: 82.6 fL (ref 80.0–100.0)
Platelets: 206 K/uL (ref 150–400)
RBC: 4.48 MIL/uL (ref 3.87–5.11)
RDW: 13.9 % (ref 11.5–15.5)
WBC: 4.7 K/uL (ref 4.0–10.5)
nRBC: 0 % (ref 0.0–0.2)

## 2023-10-05 LAB — COMPREHENSIVE METABOLIC PANEL WITH GFR
ALT: 12 U/L (ref 0–44)
AST: 16 U/L (ref 15–41)
Albumin: 3.5 g/dL (ref 3.5–5.0)
Alkaline Phosphatase: 57 U/L (ref 38–126)
Anion gap: 10 (ref 5–15)
BUN: 13 mg/dL (ref 6–20)
CO2: 22 mmol/L (ref 22–32)
Calcium: 8.9 mg/dL (ref 8.9–10.3)
Chloride: 104 mmol/L (ref 98–111)
Creatinine, Ser: 0.64 mg/dL (ref 0.44–1.00)
GFR, Estimated: >60 mL/min (ref >60)
Glucose, Bld: 81 mg/dL (ref 70–99)
Potassium: 3.2 mmol/L — ABNORMAL LOW (ref 3.5–5.1)
Sodium: 136 mmol/L (ref 135–145)
Total Bilirubin: 0.2 mg/dL (ref 0.0–1.2)
Total Protein: 6.9 g/dL (ref 6.5–8.1)

## 2023-10-05 LAB — URINALYSIS, ROUTINE W REFLEX MICROSCOPIC
Bilirubin Urine: NEGATIVE
Glucose, UA: NEGATIVE mg/dL
Hgb urine dipstick: NEGATIVE
Ketones, ur: NEGATIVE mg/dL
Nitrite: NEGATIVE
Protein, ur: 30 mg/dL — AB
Specific Gravity, Urine: 1.031 — ABNORMAL HIGH (ref 1.005–1.030)
WBC, UA: >50 WBC/hpf (ref 0–5)
pH: 5 (ref 5.0–8.0)

## 2023-10-05 LAB — HCG, QUANTITATIVE, PREGNANCY: hCG, Beta Chain, Quant, S: 70476 m[IU]/mL — ABNORMAL HIGH (ref <5)

## 2023-10-05 MED ORDER — PROMETHAZINE HCL 12.5 MG PO TABS: 12.5000 mg | ORAL_TABLET | Freq: Four times a day (QID) | ORAL | 0 refills | Status: AC | PRN

## 2023-10-05 MED ORDER — SODIUM CHLORIDE 0.9 % IV BOLUS
1000.0000 mL | Freq: Once | INTRAVENOUS | Status: AC
  Administered 2023-10-05: 1000 mL via INTRAVENOUS

## 2023-10-05 MED ORDER — METOCLOPRAMIDE HCL 5 MG/ML IJ SOLN
10.0000 mg | Freq: Once | INTRAMUSCULAR | Status: AC
  Administered 2023-10-05: 10 mg via INTRAVENOUS
  Filled 2023-10-05: qty 2

## 2023-10-05 NOTE — ED Provider Notes (Signed)
 [facility] Provider Note    Event Date/Time   First MD Initiated Contact with Patient 10/05/23 1051     (approximate)   History   Vomiting   HPI  Karen Mack is a 28 y.o. female with history of 5 miscarriages presents emergency department hyperemesis in pregnancy.  States took Reglan  at home but did not help.  States having a lot of cramping in the lower abdomen and is concerned that she could possibly be having another miscarriage.  Denies bleeding at this time.  Patient states she has been very upset and crying as she is so worried about pregnancy.  Patient is approximately [redacted] weeks pregnant      Physical Exam   Triage Vital Signs: ED Triage Vitals  Encounter Vitals Group     BP 10/05/23 1002 112/88     Girls Systolic BP Percentile --      Girls Diastolic BP Percentile --      Boys Systolic BP Percentile --      Boys Diastolic BP Percentile --      Pulse Rate 10/05/23 1002 91     Resp 10/05/23 1002 18     Temp 10/05/23 1002 98.9 F (37.2 C)     Temp Source 10/05/23 1002 Oral     SpO2 10/05/23 1002 98 %     Weight 10/05/23 1003 140 lb (63.5 kg)     Height 10/05/23 1003 5' 2 (1.575 m)     Head Circumference --      Peak Flow --      Pain Score 10/05/23 1003 4     Pain Loc --      Pain Education --      Exclude from Growth Chart --     Most recent vital signs: Vitals:   10/05/23 1002 10/05/23 1408  BP: 112/88 114/86  Pulse: 91 88  Resp: 18 17  Temp: 98.9 F (37.2 C) 98.3 F (36.8 C)  SpO2: 98% 97%     General: Awake, no distress.   CV:  Good peripheral perfusion.  Resp:  Normal effort. Abd:  No distention.   Other:      ED Results / Procedures / Treatments   Labs (all labs ordered are listed, but only abnormal results are displayed) Labs Reviewed  COMPREHENSIVE METABOLIC PANEL WITH GFR - Abnormal; Notable for the following components:      Result Value   Potassium 3.2 (*)    All other components within normal limits   URINALYSIS, ROUTINE W REFLEX MICROSCOPIC - Abnormal; Notable for the following components:   Color, Urine AMBER (*)    APPearance HAZY (*)    Specific Gravity, Urine 1.031 (*)    Protein, ur 30 (*)    Leukocytes,Ua MODERATE (*)    Bacteria, UA RARE (*)    All other components within normal limits  HCG, QUANTITATIVE, PREGNANCY - Abnormal; Notable for the following components:   hCG, Beta Chain, Quant, S 70,476 (*)    All other components within normal limits  CBC     EKG     RADIOLOGY Ultrasound abuse and 14-week    PROCEDURES:   Procedures  Critical Care:  no Chief Complaint  Patient presents with   Vomiting      MEDICATIONS ORDERED IN ED: Medications  sodium chloride  0.9 % bolus 1,000 mL (0 mLs Intravenous Stopped 10/05/23 1408)  metoCLOPramide  (REGLAN ) injection 10 mg (10 mg Intravenous Given 10/05/23 1221)  IMPRESSION / MDM / ASSESSMENT AND PLAN / ED COURSE  I reviewed the triage vital signs and the nursing notes.                              Differential diagnosis includes, but is not limited to,   Patient's presentation is most consistent with acute illness / injury with system symptoms.    Labs reassuring  Ultrasound OB less than 14 weeks, independently reviewed interpreted by me as having fetal heartbeat, confirmed by radiology  The patient was given normal saline 1 L IV, Reglan  10 mg IV.  She is feeling better after the medications.  I did prescribe her Phenergan  12.5 mg.  She can cut these in half if she continues to have vomiting.  She is in agreement with this treatment plan.  Discharged stable condition.  Strict instructions to follow-up with her OB/GYN.      FINAL CLINICAL IMPRESSION(S) / ED DIAGNOSES   Final diagnoses:  Dehydration  Nausea and vomiting in pregnancy     Rx / DC Orders   ED Discharge Orders          Ordered    promethazine  (PHENERGAN ) 12.5 MG tablet  Every 6 hours PRN        10/05/23 1420              Note:  This document was prepared using Dragon voice recognition software and may include unintentional dictation errors.    Gasper Devere ORN, PA-C 10/05/23 1813    Viviann Pastor, MD 10/06/23 [phone]

## 2023-10-05 NOTE — ED Triage Notes (Signed)
 Pt In with co vomiting, is [redacted] weeks pregnant. Seen here for the same last week and was given IV fluids and reglan . Pt has hx of the same. Denies any vag bleeding, does have some lower abd pain.
